# Patient Record
Sex: Male | Born: 1965 | Race: White | Marital: Single
Health system: Northeastern US, Academic
[De-identification: ages and names within clinical notes are randomized; demographics above are authoritative.]

## PROBLEM LIST (undated history)

## (undated) DIAGNOSIS — M545 Low back pain, unspecified: Secondary | ICD-10-CM

## (undated) DIAGNOSIS — G8929 Other chronic pain: Secondary | ICD-10-CM

## (undated) DIAGNOSIS — J449 Chronic obstructive pulmonary disease, unspecified: Secondary | ICD-10-CM

## (undated) DIAGNOSIS — J189 Pneumonia, unspecified organism: Secondary | ICD-10-CM

## (undated) DIAGNOSIS — I10 Essential (primary) hypertension: Secondary | ICD-10-CM

## (undated) HISTORY — PX: BACK SURGERY: SHX140

---

## 2013-08-26 DIAGNOSIS — R059 Cough, unspecified: Secondary | ICD-10-CM | POA: Diagnosis not present

## 2013-08-26 DIAGNOSIS — M545 Low back pain, unspecified: Secondary | ICD-10-CM | POA: Diagnosis not present

## 2013-08-26 DIAGNOSIS — R05 Cough: Secondary | ICD-10-CM | POA: Diagnosis not present

## 2013-08-26 DIAGNOSIS — J019 Acute sinusitis, unspecified: Secondary | ICD-10-CM | POA: Diagnosis not present

## 2013-08-26 DIAGNOSIS — M549 Dorsalgia, unspecified: Secondary | ICD-10-CM | POA: Diagnosis not present

## 2013-09-13 DIAGNOSIS — R05 Cough: Secondary | ICD-10-CM | POA: Diagnosis not present

## 2013-09-13 DIAGNOSIS — R112 Nausea with vomiting, unspecified: Secondary | ICD-10-CM | POA: Diagnosis not present

## 2013-09-13 DIAGNOSIS — R059 Cough, unspecified: Secondary | ICD-10-CM | POA: Diagnosis not present

## 2013-09-13 DIAGNOSIS — R0602 Shortness of breath: Secondary | ICD-10-CM | POA: Diagnosis not present

## 2013-09-13 DIAGNOSIS — J189 Pneumonia, unspecified organism: Secondary | ICD-10-CM | POA: Diagnosis not present

## 2013-09-15 DIAGNOSIS — R059 Cough, unspecified: Secondary | ICD-10-CM | POA: Diagnosis not present

## 2013-09-15 DIAGNOSIS — R05 Cough: Secondary | ICD-10-CM | POA: Diagnosis not present

## 2013-09-22 DIAGNOSIS — M2550 Pain in unspecified joint: Secondary | ICD-10-CM | POA: Diagnosis not present

## 2013-09-22 DIAGNOSIS — I10 Essential (primary) hypertension: Secondary | ICD-10-CM | POA: Diagnosis not present

## 2013-09-22 DIAGNOSIS — J449 Chronic obstructive pulmonary disease, unspecified: Secondary | ICD-10-CM | POA: Diagnosis not present

## 2013-09-22 DIAGNOSIS — E291 Testicular hypofunction: Secondary | ICD-10-CM | POA: Diagnosis not present

## 2013-10-16 DIAGNOSIS — J209 Acute bronchitis, unspecified: Secondary | ICD-10-CM | POA: Diagnosis not present

## 2013-10-16 DIAGNOSIS — R0902 Hypoxemia: Secondary | ICD-10-CM | POA: Diagnosis not present

## 2013-10-19 DIAGNOSIS — R05 Cough: Secondary | ICD-10-CM | POA: Diagnosis not present

## 2013-10-19 DIAGNOSIS — R059 Cough, unspecified: Secondary | ICD-10-CM | POA: Diagnosis not present

## 2013-10-19 DIAGNOSIS — J189 Pneumonia, unspecified organism: Secondary | ICD-10-CM | POA: Diagnosis not present

## 2013-10-19 DIAGNOSIS — M25559 Pain in unspecified hip: Secondary | ICD-10-CM | POA: Diagnosis not present

## 2013-10-19 DIAGNOSIS — J209 Acute bronchitis, unspecified: Secondary | ICD-10-CM | POA: Diagnosis not present

## 2013-10-19 DIAGNOSIS — J449 Chronic obstructive pulmonary disease, unspecified: Secondary | ICD-10-CM | POA: Diagnosis not present

## 2013-10-19 DIAGNOSIS — E291 Testicular hypofunction: Secondary | ICD-10-CM | POA: Diagnosis not present

## 2013-11-02 DIAGNOSIS — F411 Generalized anxiety disorder: Secondary | ICD-10-CM | POA: Diagnosis not present

## 2013-11-02 DIAGNOSIS — I10 Essential (primary) hypertension: Secondary | ICD-10-CM | POA: Diagnosis not present

## 2013-11-02 DIAGNOSIS — M25559 Pain in unspecified hip: Secondary | ICD-10-CM | POA: Diagnosis not present

## 2013-11-02 DIAGNOSIS — J189 Pneumonia, unspecified organism: Secondary | ICD-10-CM | POA: Diagnosis not present

## 2013-11-02 DIAGNOSIS — Z87891 Personal history of nicotine dependence: Secondary | ICD-10-CM | POA: Diagnosis not present

## 2013-11-02 DIAGNOSIS — Z0000 Encounter for general adult medical examination without abnormal findings: Secondary | ICD-10-CM | POA: Diagnosis not present

## 2013-11-02 DIAGNOSIS — E291 Testicular hypofunction: Secondary | ICD-10-CM | POA: Diagnosis not present

## 2013-11-02 DIAGNOSIS — J449 Chronic obstructive pulmonary disease, unspecified: Secondary | ICD-10-CM | POA: Diagnosis not present

## 2013-11-15 DIAGNOSIS — J392 Other diseases of pharynx: Secondary | ICD-10-CM | POA: Diagnosis not present

## 2013-11-15 DIAGNOSIS — I10 Essential (primary) hypertension: Secondary | ICD-10-CM | POA: Diagnosis not present

## 2013-11-15 DIAGNOSIS — J189 Pneumonia, unspecified organism: Secondary | ICD-10-CM | POA: Diagnosis not present

## 2013-11-15 DIAGNOSIS — J449 Chronic obstructive pulmonary disease, unspecified: Secondary | ICD-10-CM | POA: Diagnosis not present

## 2013-12-12 DIAGNOSIS — J449 Chronic obstructive pulmonary disease, unspecified: Secondary | ICD-10-CM | POA: Diagnosis not present

## 2013-12-12 DIAGNOSIS — I10 Essential (primary) hypertension: Secondary | ICD-10-CM | POA: Diagnosis not present

## 2013-12-12 DIAGNOSIS — R059 Cough, unspecified: Secondary | ICD-10-CM | POA: Diagnosis not present

## 2013-12-12 DIAGNOSIS — R05 Cough: Secondary | ICD-10-CM | POA: Diagnosis not present

## 2013-12-12 DIAGNOSIS — M549 Dorsalgia, unspecified: Secondary | ICD-10-CM | POA: Diagnosis not present

## 2013-12-16 DIAGNOSIS — R0609 Other forms of dyspnea: Secondary | ICD-10-CM | POA: Diagnosis not present

## 2013-12-16 DIAGNOSIS — R0989 Other specified symptoms and signs involving the circulatory and respiratory systems: Secondary | ICD-10-CM | POA: Diagnosis not present

## 2013-12-20 DIAGNOSIS — R5383 Other fatigue: Secondary | ICD-10-CM | POA: Diagnosis not present

## 2013-12-20 DIAGNOSIS — R5381 Other malaise: Secondary | ICD-10-CM | POA: Diagnosis not present

## 2013-12-20 DIAGNOSIS — M542 Cervicalgia: Secondary | ICD-10-CM | POA: Diagnosis not present

## 2013-12-20 DIAGNOSIS — I429 Cardiomyopathy, unspecified: Secondary | ICD-10-CM | POA: Diagnosis not present

## 2013-12-20 DIAGNOSIS — J449 Chronic obstructive pulmonary disease, unspecified: Secondary | ICD-10-CM | POA: Diagnosis not present

## 2014-01-02 DIAGNOSIS — D1039 Benign neoplasm of other parts of mouth: Secondary | ICD-10-CM | POA: Diagnosis not present

## 2014-01-02 DIAGNOSIS — R498 Other voice and resonance disorders: Secondary | ICD-10-CM | POA: Diagnosis not present

## 2014-02-07 DIAGNOSIS — E291 Testicular hypofunction: Secondary | ICD-10-CM | POA: Diagnosis not present

## 2014-02-07 DIAGNOSIS — M549 Dorsalgia, unspecified: Secondary | ICD-10-CM | POA: Diagnosis not present

## 2014-02-07 DIAGNOSIS — J449 Chronic obstructive pulmonary disease, unspecified: Secondary | ICD-10-CM | POA: Diagnosis not present

## 2014-02-07 DIAGNOSIS — I10 Essential (primary) hypertension: Secondary | ICD-10-CM | POA: Diagnosis not present

## 2014-02-13 DIAGNOSIS — M545 Low back pain, unspecified: Secondary | ICD-10-CM | POA: Diagnosis not present

## 2014-02-13 DIAGNOSIS — M5137 Other intervertebral disc degeneration, lumbosacral region: Secondary | ICD-10-CM | POA: Diagnosis not present

## 2014-02-13 DIAGNOSIS — IMO0002 Reserved for concepts with insufficient information to code with codable children: Secondary | ICD-10-CM | POA: Diagnosis not present

## 2014-03-06 DIAGNOSIS — F17200 Nicotine dependence, unspecified, uncomplicated: Secondary | ICD-10-CM | POA: Diagnosis not present

## 2014-03-06 DIAGNOSIS — E291 Testicular hypofunction: Secondary | ICD-10-CM | POA: Diagnosis not present

## 2014-03-06 DIAGNOSIS — J449 Chronic obstructive pulmonary disease, unspecified: Secondary | ICD-10-CM | POA: Diagnosis not present

## 2014-03-06 DIAGNOSIS — F411 Generalized anxiety disorder: Secondary | ICD-10-CM | POA: Diagnosis not present

## 2014-03-06 DIAGNOSIS — M25559 Pain in unspecified hip: Secondary | ICD-10-CM | POA: Diagnosis not present

## 2014-03-06 DIAGNOSIS — E669 Obesity, unspecified: Secondary | ICD-10-CM | POA: Diagnosis not present

## 2014-03-06 DIAGNOSIS — G608 Other hereditary and idiopathic neuropathies: Secondary | ICD-10-CM | POA: Diagnosis not present

## 2014-03-06 DIAGNOSIS — I10 Essential (primary) hypertension: Secondary | ICD-10-CM | POA: Diagnosis not present

## 2014-03-21 DIAGNOSIS — M25559 Pain in unspecified hip: Secondary | ICD-10-CM | POA: Diagnosis not present

## 2014-03-21 DIAGNOSIS — E278 Other specified disorders of adrenal gland: Secondary | ICD-10-CM | POA: Diagnosis not present

## 2014-04-03 DIAGNOSIS — M545 Low back pain, unspecified: Secondary | ICD-10-CM | POA: Diagnosis not present

## 2014-04-03 DIAGNOSIS — M549 Dorsalgia, unspecified: Secondary | ICD-10-CM | POA: Diagnosis not present

## 2014-04-03 DIAGNOSIS — F411 Generalized anxiety disorder: Secondary | ICD-10-CM | POA: Diagnosis not present

## 2014-04-03 DIAGNOSIS — I10 Essential (primary) hypertension: Secondary | ICD-10-CM | POA: Diagnosis not present

## 2014-05-03 DIAGNOSIS — G608 Other hereditary and idiopathic neuropathies: Secondary | ICD-10-CM | POA: Diagnosis not present

## 2014-05-03 DIAGNOSIS — J449 Chronic obstructive pulmonary disease, unspecified: Secondary | ICD-10-CM | POA: Diagnosis not present

## 2014-05-03 DIAGNOSIS — Z6836 Body mass index (BMI) 36.0-36.9, adult: Secondary | ICD-10-CM | POA: Diagnosis not present

## 2014-05-03 DIAGNOSIS — E291 Testicular hypofunction: Secondary | ICD-10-CM | POA: Diagnosis not present

## 2014-05-03 DIAGNOSIS — I10 Essential (primary) hypertension: Secondary | ICD-10-CM | POA: Diagnosis not present

## 2014-05-13 DIAGNOSIS — R0602 Shortness of breath: Secondary | ICD-10-CM | POA: Diagnosis not present

## 2014-05-13 DIAGNOSIS — K219 Gastro-esophageal reflux disease without esophagitis: Secondary | ICD-10-CM | POA: Diagnosis not present

## 2014-05-13 DIAGNOSIS — I10 Essential (primary) hypertension: Secondary | ICD-10-CM | POA: Diagnosis not present

## 2014-05-13 DIAGNOSIS — J189 Pneumonia, unspecified organism: Secondary | ICD-10-CM | POA: Diagnosis not present

## 2014-05-13 DIAGNOSIS — J441 Chronic obstructive pulmonary disease with (acute) exacerbation: Secondary | ICD-10-CM | POA: Diagnosis not present

## 2014-05-13 DIAGNOSIS — J449 Chronic obstructive pulmonary disease, unspecified: Secondary | ICD-10-CM | POA: Diagnosis not present

## 2014-05-13 DIAGNOSIS — G8929 Other chronic pain: Secondary | ICD-10-CM | POA: Diagnosis present

## 2014-05-13 DIAGNOSIS — Z6838 Body mass index (BMI) 38.0-38.9, adult: Secondary | ICD-10-CM | POA: Diagnosis not present

## 2014-05-13 DIAGNOSIS — J9600 Acute respiratory failure, unspecified whether with hypoxia or hypercapnia: Secondary | ICD-10-CM | POA: Diagnosis not present

## 2014-05-13 DIAGNOSIS — G4733 Obstructive sleep apnea (adult) (pediatric): Secondary | ICD-10-CM | POA: Diagnosis not present

## 2014-05-13 DIAGNOSIS — R51 Headache: Secondary | ICD-10-CM | POA: Diagnosis not present

## 2014-05-13 DIAGNOSIS — Z87891 Personal history of nicotine dependence: Secondary | ICD-10-CM | POA: Diagnosis not present

## 2014-05-13 DIAGNOSIS — M549 Dorsalgia, unspecified: Secondary | ICD-10-CM | POA: Diagnosis present

## 2014-05-18 DIAGNOSIS — M25559 Pain in unspecified hip: Secondary | ICD-10-CM | POA: Diagnosis not present

## 2014-05-18 DIAGNOSIS — M76899 Other specified enthesopathies of unspecified lower limb, excluding foot: Secondary | ICD-10-CM | POA: Diagnosis not present

## 2014-05-30 DIAGNOSIS — Z6837 Body mass index (BMI) 37.0-37.9, adult: Secondary | ICD-10-CM | POA: Diagnosis not present

## 2014-05-30 DIAGNOSIS — I10 Essential (primary) hypertension: Secondary | ICD-10-CM | POA: Diagnosis not present

## 2014-05-30 DIAGNOSIS — M549 Dorsalgia, unspecified: Secondary | ICD-10-CM | POA: Diagnosis not present

## 2014-05-30 DIAGNOSIS — Z23 Encounter for immunization: Secondary | ICD-10-CM | POA: Diagnosis not present

## 2014-05-30 DIAGNOSIS — J449 Chronic obstructive pulmonary disease, unspecified: Secondary | ICD-10-CM | POA: Diagnosis not present

## 2014-05-30 DIAGNOSIS — E291 Testicular hypofunction: Secondary | ICD-10-CM | POA: Diagnosis not present

## 2014-05-30 DIAGNOSIS — Z6836 Body mass index (BMI) 36.0-36.9, adult: Secondary | ICD-10-CM | POA: Diagnosis not present

## 2014-06-29 DIAGNOSIS — F419 Anxiety disorder, unspecified: Secondary | ICD-10-CM | POA: Diagnosis not present

## 2014-06-29 DIAGNOSIS — K219 Gastro-esophageal reflux disease without esophagitis: Secondary | ICD-10-CM | POA: Diagnosis not present

## 2014-06-29 DIAGNOSIS — I10 Essential (primary) hypertension: Secondary | ICD-10-CM | POA: Diagnosis not present

## 2014-06-29 DIAGNOSIS — J449 Chronic obstructive pulmonary disease, unspecified: Secondary | ICD-10-CM | POA: Diagnosis not present

## 2014-06-29 DIAGNOSIS — G8929 Other chronic pain: Secondary | ICD-10-CM | POA: Diagnosis not present

## 2014-06-29 DIAGNOSIS — M545 Low back pain: Secondary | ICD-10-CM | POA: Diagnosis not present

## 2014-06-30 DIAGNOSIS — D869 Sarcoidosis, unspecified: Secondary | ICD-10-CM | POA: Diagnosis not present

## 2014-06-30 DIAGNOSIS — I10 Essential (primary) hypertension: Secondary | ICD-10-CM | POA: Diagnosis not present

## 2014-06-30 DIAGNOSIS — J449 Chronic obstructive pulmonary disease, unspecified: Secondary | ICD-10-CM | POA: Diagnosis not present

## 2014-06-30 DIAGNOSIS — M545 Low back pain: Secondary | ICD-10-CM | POA: Diagnosis not present

## 2014-07-27 DIAGNOSIS — Z79899 Other long term (current) drug therapy: Secondary | ICD-10-CM | POA: Diagnosis not present

## 2014-07-27 DIAGNOSIS — M961 Postlaminectomy syndrome, not elsewhere classified: Secondary | ICD-10-CM | POA: Diagnosis not present

## 2014-07-27 DIAGNOSIS — M47817 Spondylosis without myelopathy or radiculopathy, lumbosacral region: Secondary | ICD-10-CM | POA: Diagnosis not present

## 2014-07-27 DIAGNOSIS — Z79891 Long term (current) use of opiate analgesic: Secondary | ICD-10-CM | POA: Diagnosis not present

## 2014-07-27 DIAGNOSIS — M5137 Other intervertebral disc degeneration, lumbosacral region: Secondary | ICD-10-CM | POA: Diagnosis not present

## 2014-07-27 DIAGNOSIS — G894 Chronic pain syndrome: Secondary | ICD-10-CM | POA: Diagnosis not present

## 2014-08-27 ENCOUNTER — Emergency Department (HOSPITAL_COMMUNITY)
Admission: EM | Admit: 2014-08-27 | Discharge: 2014-08-27 | Disposition: A | Discharge status: Home / Self Care | Payer: Medicare Other | Attending: Emergency Medicine | Admitting: Emergency Medicine

## 2014-08-27 ENCOUNTER — Emergency Department (HOSPITAL_COMMUNITY): Payer: Medicare Other

## 2014-08-27 ENCOUNTER — Encounter (HOSPITAL_COMMUNITY): Payer: Self-pay | Admitting: Emergency Medicine

## 2014-08-27 DIAGNOSIS — J441 Chronic obstructive pulmonary disease with (acute) exacerbation: Secondary | ICD-10-CM | POA: Insufficient documentation

## 2014-08-27 DIAGNOSIS — J189 Pneumonia, unspecified organism: Secondary | ICD-10-CM | POA: Diagnosis not present

## 2014-08-27 DIAGNOSIS — Z7951 Long term (current) use of inhaled steroids: Secondary | ICD-10-CM | POA: Insufficient documentation

## 2014-08-27 DIAGNOSIS — Z7982 Long term (current) use of aspirin: Secondary | ICD-10-CM | POA: Diagnosis not present

## 2014-08-27 DIAGNOSIS — E669 Obesity, unspecified: Secondary | ICD-10-CM | POA: Insufficient documentation

## 2014-08-27 DIAGNOSIS — J984 Other disorders of lung: Secondary | ICD-10-CM | POA: Diagnosis not present

## 2014-08-27 DIAGNOSIS — I10 Essential (primary) hypertension: Secondary | ICD-10-CM | POA: Diagnosis not present

## 2014-08-27 DIAGNOSIS — G8929 Other chronic pain: Secondary | ICD-10-CM | POA: Diagnosis not present

## 2014-08-27 DIAGNOSIS — Z720 Tobacco use: Secondary | ICD-10-CM | POA: Insufficient documentation

## 2014-08-27 DIAGNOSIS — J159 Unspecified bacterial pneumonia: Secondary | ICD-10-CM | POA: Diagnosis not present

## 2014-08-27 DIAGNOSIS — Z79899 Other long term (current) drug therapy: Secondary | ICD-10-CM | POA: Insufficient documentation

## 2014-08-27 DIAGNOSIS — R0602 Shortness of breath: Secondary | ICD-10-CM | POA: Diagnosis present

## 2014-08-27 HISTORY — DX: Essential (primary) hypertension: I10

## 2014-08-27 HISTORY — DX: Chronic obstructive pulmonary disease, unspecified: J44.9

## 2014-08-27 HISTORY — DX: Low back pain, unspecified: M54.50

## 2014-08-27 HISTORY — DX: Low back pain: M54.5

## 2014-08-27 HISTORY — DX: Other chronic pain: G89.29

## 2014-08-27 LAB — CBC
HCT: 39.9 % (ref 39.0–52.0)
Hemoglobin: 13.4 g/dL (ref 13.0–17.0)
MCH: 33 pg (ref 26.0–34.0)
MCHC: 33.6 g/dL (ref 30.0–36.0)
MCV: 98.3 fL (ref 78.0–100.0)
Platelets: 269 10*3/uL (ref 150–400)
RBC: 4.06 MIL/uL — ABNORMAL LOW (ref 4.22–5.81)
RDW: 12.6 % (ref 11.5–15.5)
WBC: 26.6 10*3/uL — ABNORMAL HIGH (ref 4.0–10.5)

## 2014-08-27 LAB — BASIC METABOLIC PANEL
Anion gap: 8 (ref 5–15)
BUN: 15 mg/dL (ref 6–23)
CO2: 29 mmol/L (ref 19–32)
Calcium: 8.5 mg/dL (ref 8.4–10.5)
Chloride: 100 mEq/L (ref 96–112)
Creatinine, Ser: 0.77 mg/dL (ref 0.50–1.35)
GFR calc Af Amer: >90 mL/min (ref >90)
GFR calc non Af Amer: >90 mL/min (ref >90)
Glucose, Bld: 128 mg/dL — ABNORMAL HIGH (ref 70–99)
Potassium: 3.7 mmol/L (ref 3.5–5.1)
Sodium: 137 mmol/L (ref 135–145)

## 2014-08-27 LAB — I-STAT TROPONIN, ED: Troponin i, poc: 0 ng/mL (ref 0.00–0.08)

## 2014-08-27 MED ORDER — OXYCODONE HCL 5 MG PO TABS
20.0000 mg | ORAL_TABLET | Freq: Once | ORAL | Status: AC
  Administered 2014-08-27: 20 mg via ORAL
  Filled 2014-08-27: qty 4

## 2014-08-27 MED ORDER — IPRATROPIUM-ALBUTEROL 0.5-2.5 (3) MG/3ML IN SOLN: 3.0000 mL | RESPIRATORY_TRACT | Status: AC | PRN

## 2014-08-27 MED ORDER — DEXTROSE 5 % IV SOLN
500.0000 mg | Freq: Once | INTRAVENOUS | Status: AC
  Administered 2014-08-27: 500 mg via INTRAVENOUS
  Filled 2014-08-27: qty 500

## 2014-08-27 MED ORDER — AZITHROMYCIN 250 MG PO TABS: 250.0000 mg | ORAL_TABLET | Freq: Every day | ORAL | Status: DC

## 2014-08-27 MED ORDER — ONDANSETRON 4 MG PO TBDP: ORAL_TABLET | ORAL | Status: AC

## 2014-08-27 MED ORDER — IPRATROPIUM-ALBUTEROL 0.5-2.5 (3) MG/3ML IN SOLN
3.0000 mL | Freq: Once | RESPIRATORY_TRACT | Status: AC
  Administered 2014-08-27: 3 mL via RESPIRATORY_TRACT
  Filled 2014-08-27: qty 3

## 2014-08-27 MED ORDER — DEXTROSE 5 % IV SOLN
1.0000 g | Freq: Once | INTRAVENOUS | Status: AC
  Administered 2014-08-27: 1 g via INTRAVENOUS
  Filled 2014-08-27: qty 10

## 2014-08-27 NOTE — ED Notes (Signed)
Pulse Ox dropped to 88% while ambulating.

## 2014-08-27 NOTE — ED Provider Notes (Signed)
CSN: [id number]     Arrival date & time 08/27/14  1240 History   First MD Initiated Contact with Patient 08/27/14 1414     Chief Complaint  Patient presents with  . Shortness of Breath  . Headache  . upper body pain      (Consider location/radiation/quality/duration/timing/severity/associated sxs/prior Treatment) HPI Trevor Sullivan is a 49 y.o. male with a history of COPD, hypertension comes in for evaluation of cough and shortness of breath. Patient states he began to feel bad this morning with headache,chest discomfort, cough, shortness of breath, he had his oxygen saturations checked and reported it to be 82%. He also reports an associated fever at home, but did not measure it. He is not on home oxygen. He reports his significant other is a PA who evaluated him this morning. He did not try anything for his symptoms. He denies , nausea, vomiting, diarrhea, constipation, overt chest pain, numbness or weakness. No other modifying factors Smoker. Past Medical History  Diagnosis Date  . Hypertension   . COPD (chronic obstructive pulmonary disease)   . Chronic low back pain    Past Surgical History  Procedure Laterality Date  . Back surgery     No family history on file. History  Substance Use Topics  . Smoking status: Current Every Day Smoker    Types: Cigarettes  . Smokeless tobacco: Not on file  . Alcohol Use: No    Review of Systems  A 10 point review of systems was completed and was negative except for pertinent positives and negatives as mentioned in the history of present illness    Allergies  Codeine; Morphine and related; and Other  Home Medications   Prior to Admission medications   Medication Sig Start Date End Date Taking? Authorizing Provider  amLODipine-benazepril (LOTREL) 10-40 MG per capsule Take 1 capsule by mouth daily.   Yes Historical Provider, MD  aspirin EC 81 MG tablet Take 81 mg by mouth daily.   Yes Historical Provider, MD  bisoprolol (ZEBETA) 10  MG tablet Take 20 mg by mouth daily.   Yes Historical Provider, MD  budesonide-formoterol (SYMBICORT) 160-4.5 MCG/ACT inhaler Inhale 2 puffs into the lungs daily.   Yes Historical Provider, MD  cloNIDine (CATAPRES) 0.2 MG tablet Take 0.2 mg by mouth 2 (two) times daily.   Yes Historical Provider, MD  diazepam (VALIUM) 5 MG tablet Take 5 mg by mouth 3 (three) times daily.   Yes Historical Provider, MD  fentaNYL (DURAGESIC - DOSED MCG/HR) 100 MCG/HR Place 100 mcg onto the skin every other day.   Yes Historical Provider, MD  gabapentin (NEURONTIN) 600 MG tablet Take 1,200 mg by mouth 4 (four) times daily.   Yes Historical Provider, MD  ibuprofen (ADVIL,MOTRIN) 200 MG tablet Take 400 mg by mouth every 6 (six) hours as needed for fever or moderate pain.   Yes Historical Provider, MD  ondansetron (ZOFRAN) 4 MG tablet Take 4 mg by mouth 2 (two) times daily.   Yes Historical Provider, MD  oxycodone (ROXICODONE) 30 MG immediate release tablet Take 30 mg by mouth 3 (three) times daily.   Yes Historical Provider, MD  pantoprazole (PROTONIX) 40 MG tablet Take 40 mg by mouth daily.   Yes Historical Provider, MD  tiotropium (SPIRIVA) 18 MCG inhalation capsule Place 18 mcg into inhaler and inhale daily.   Yes Historical Provider, MD  azithromycin (ZITHROMAX) 250 MG tablet Take 1 tablet (250 mg total) by mouth daily. Take first 2 tablets together, then 1  every day until finished. 08/27/14   Viona Gilmore Trevor Dinkins, PA-C  ipratropium-albuterol (DUONEB) 0.5-2.5 (3) MG/3ML SOLN Take 3 mLs by nebulization every 4 (four) hours as needed. 08/27/14   Verl Dicker, PA-C  ondansetron (ZOFRAN ODT) 4 MG disintegrating tablet 4mg  ODT q4 hours prn nausea/vomit 08/27/14   Viona Gilmore Trevor Skufca, PA-C   BP 121/52 mmHg  Pulse 70  Temp(Src) 98.7 F (37.1 C) (Oral)  Resp 16  Ht 5\' 4"  (1.626 m)  Wt 220 lb (99.791 kg)  BMI 37.74 kg/m2  SpO2 95% Physical Exam  Constitutional: He is oriented to person, place, and time. He appears  well-developed and well-nourished.  obese  HENT:  Head: Normocephalic and atraumatic.  Mouth/Throat: Oropharynx is clear and moist.  Eyes: Conjunctivae are normal. Pupils are equal, round, and reactive to light. Right eye exhibits no discharge. Left eye exhibits no discharge. No scleral icterus.  Neck: Neck supple.  Cardiovascular: Normal rate, regular rhythm and normal heart sounds.   Pulmonary/Chest: Effort normal. No respiratory distress.  Diffuse coarse breath sounds in right lung with expiratory wheezing heard diffusely in all fields  Abdominal: Soft. There is no tenderness.  Musculoskeletal: He exhibits no tenderness.  Neurological: He is alert and oriented to person, place, and time.  Cranial Nerves II-XII grossly intact  Skin: Skin is warm and dry. No rash noted.  Psychiatric: He has a normal mood and affect.  Nursing note and vitals reviewed.   ED Course  Procedures (including critical care time) Labs Review Labs Reviewed  BASIC METABOLIC PANEL - Abnormal; Notable for the following:    Glucose, Bld 128 (*)    All other components within normal limits  CBC - Abnormal; Notable for the following:    WBC 26.6 (*)    RBC 4.06 (*)    All other components within normal limits  I-STAT TROPOININ, ED    Imaging Review Dg Chest 2 View (if Patient Has Fever And/or Copd)  08/27/2014   CLINICAL DATA:  Initial encounter for Cough today  EXAM: CHEST  2 VIEW  COMPARISON:  None.  FINDINGS: Two view exam shows patchy airspace disease in the right upper and lower lung. Left lung is clear. The cardiopericardial silhouette is within normal limits for size. Imaged bony structures of the thorax are intact. Telemetry leads overlie the chest.  IMPRESSION: Patchy right lung airspace disease suggest multifocal pneumonia. Follow-up imaging is recommended to ensure resolution.   Electronically Signed   By: Misty Stanley M.D.   On: 08/27/2014 13:44     EKG Interpretation None     Meds given in  ED:  Medications  azithromycin (ZITHROMAX) 500 mg in dextrose 5 % 250 mL IVPB (500 mg Intravenous New Bag/Given 08/27/14 1600)  ipratropium-albuterol (DUONEB) 0.5-2.5 (3) MG/3ML nebulizer solution 3 mL (3 mLs Nebulization Given 08/27/14 1403)  oxyCODONE (Oxy IR/ROXICODONE) immediate release tablet 20 mg (20 mg Oral Given 08/27/14 1452)  cefTRIAXone (ROCEPHIN) 1 g in dextrose 5 % 50 mL IVPB (0 g Intravenous Stopped 08/27/14 1559)    New Prescriptions   AZITHROMYCIN (ZITHROMAX) 250 MG TABLET    Take 1 tablet (250 mg total) by mouth daily. Take first 2 tablets together, then 1 every day until finished.   IPRATROPIUM-ALBUTEROL (DUONEB) 0.5-2.5 (3) MG/3ML SOLN    Take 3 mLs by nebulization every 4 (four) hours as needed.   ONDANSETRON (ZOFRAN ODT) 4 MG DISINTEGRATING TABLET    4mg  ODT q4 hours prn nausea/vomit   Filed Vitals:  08/27/14 1315 08/27/14 1317 08/27/14 1441 08/27/14 1603  BP:   95/61 121/52  Pulse:   79 70  Temp:      TempSrc:      Resp:   16   Height:  5\' 4"  (1.626 m)    Weight:  220 lb (99.791 kg)    SpO2: 94%  94% 95%    MDM  Trevor Sullivan is a 49 y.o. male who presents for evaluation of fever, cough, chest congestion, shortness of breath for the past day.  Course breath sounds with wheezing diffusely. Found to have multifocal pneumonia on x-ray. Leukocytosis of 26.6 Patient ambulated in the hall and saturations dropped to 88%.  Refuses admission. Prefers to go home with outpatient antibiotics and follow-up with his primary care. States his wife is a PA and will take care of him at home. Discussed the risks and benefits of receiving outpatient therapy, patient understands and is willing to assume this risk.  Received IV abx in ED. Will DC with azithromycin, DuoNeb refill, Zofran and instructions to follow-up with PCP  Prior to patient discharge, I discussed and reviewed this case with Dr.Kohut  Patient may be discharged following completion of IV antibiotic regimen. Care  transferred to Domenic Moras, PA-C to complete discharge.    Final diagnoses:  CAP (community acquired pneumonia)        Verl Dicker, PA-C 08/28/14 1154  Virgel Manifold, MD 08/31/14 [phone]

## 2014-08-27 NOTE — ED Notes (Addendum)
Pt c/o shob, headache and body pain from waist up that started day before yesterday but got worse yesterday. Visitor with pt states that she is PA, this morning pt's O2 level was in 80's on room air. Pt had temperature 101.

## 2014-08-27 NOTE — ED Provider Notes (Signed)
Pt with newly diagnosed multifocal pneumonia, likely CAP.  He initially was hypoxic, hypotensive, and with leukocytosis WBC 26.  Pt meets admission criteria however he prefers to be discharged and treated outpt instead.  He is aware of risk/benefit and understand to return if condition worsen.  His wife is a PA.  Pt amenable to receive IV abx here.  Will monitor and d/c once abx is finished.  Pt does have a PCP and agrees to f/u for repeat CXR.    5:09 PM Patient received his antibiotic and now he would like to be discharged. He is aware of risk and will return promptly if his condition worsened.  BP 121/52 mmHg  Pulse 70  Temp(Src) 98.7 F (37.1 C) (Oral)  Resp 16  Ht 5\' 4"  (1.626 m)  Wt 220 lb (99.791 kg)  BMI 37.74 kg/m2  SpO2 95%  I have reviewed nursing notes and vital signs. I personally reviewed the imaging tests through PACS system  I reviewed available ER/hospitalization records thought the EMR  Results for orders placed or performed during the hospital encounter of 81/15/72  Basic metabolic panel    (if pt has PMH of COPD)  Result Value Ref Range   Sodium 137 135 - 145 mmol/L   Potassium 3.7 3.5 - 5.1 mmol/L   Chloride 100 96 - 112 mEq/L   CO2 29 19 - 32 mmol/L   Glucose, Bld 128 (H) 70 - 99 mg/dL   BUN 15 6 - 23 mg/dL   Creatinine, Ser 0.77 0.50 - 1.35 mg/dL   Calcium 8.5 8.4 - 10.5 mg/dL   GFR calc non Af Amer >90 >90 mL/min   GFR calc Af Amer >90 >90 mL/min   Anion gap 8 5 - 15  CBC     (if pt has PMH of COPD)  Result Value Ref Range   WBC 26.6 (H) 4.0 - 10.5 K/uL   RBC 4.06 (L) 4.22 - 5.81 MIL/uL   Hemoglobin 13.4 13.0 - 17.0 g/dL   HCT 39.9 39.0 - 52.0 %   MCV 98.3 78.0 - 100.0 fL   MCH 33.0 26.0 - 34.0 pg   MCHC 33.6 30.0 - 36.0 g/dL   RDW 12.6 11.5 - 15.5 %   Platelets 269 150 - 400 K/uL  I-stat troponin, ED (if patient has history of COPD)  Result Value Ref Range   Troponin i, poc 0.00 0.00 - 0.08 ng/mL   Comment 3           Dg Chest 2 View (if  Patient Has Fever And/or Copd)  08/27/2014   CLINICAL DATA:  Initial encounter for Cough today  EXAM: CHEST  2 VIEW  COMPARISON:  None.  FINDINGS: Two view exam shows patchy airspace disease in the right upper and lower lung. Left lung is clear. The cardiopericardial silhouette is within normal limits for size. Imaged bony structures of the thorax are intact. Telemetry leads overlie the chest.  IMPRESSION: Patchy right lung airspace disease suggest multifocal pneumonia. Follow-up imaging is recommended to ensure resolution.   Electronically Signed   By: Misty Stanley M.D.   On: 08/27/2014 13:44      Domenic Moras, PA-C 08/27/14 [place], MD 08/27/14 2204

## 2014-08-27 NOTE — ED Notes (Signed)
EKG completed by Almyra Free, NT.

## 2014-08-27 NOTE — Discharge Instructions (Signed)
It is important for you to follow up with primary care for further evaluation and management of your symptoms. You will need to take all of your antibiotic as prescribed even If you begin to feel better. Please return to ED for worsening symptoms.

## 2014-08-27 NOTE — ED Notes (Signed)
[place], [place] notified that pt wants pain meds

## 2014-08-29 DIAGNOSIS — D72829 Elevated white blood cell count, unspecified: Secondary | ICD-10-CM | POA: Diagnosis not present

## 2014-08-29 DIAGNOSIS — I10 Essential (primary) hypertension: Secondary | ICD-10-CM | POA: Diagnosis not present

## 2014-08-29 DIAGNOSIS — R072 Precordial pain: Secondary | ICD-10-CM | POA: Diagnosis not present

## 2014-08-29 DIAGNOSIS — J189 Pneumonia, unspecified organism: Secondary | ICD-10-CM | POA: Diagnosis not present

## 2014-09-08 DIAGNOSIS — Z0000 Encounter for general adult medical examination without abnormal findings: Secondary | ICD-10-CM | POA: Diagnosis not present

## 2014-09-21 DIAGNOSIS — I10 Essential (primary) hypertension: Secondary | ICD-10-CM | POA: Diagnosis not present

## 2014-09-21 DIAGNOSIS — G894 Chronic pain syndrome: Secondary | ICD-10-CM | POA: Diagnosis not present

## 2014-10-23 ENCOUNTER — Inpatient Hospital Stay (HOSPITAL_COMMUNITY)
Admission: EM | Admit: 2014-10-23 | Discharge: 2014-10-25 | DRG: 193 | Disposition: A | Discharge status: Home / Self Care | Payer: Medicare Other | Attending: Internal Medicine | Admitting: Internal Medicine

## 2014-10-23 ENCOUNTER — Emergency Department (HOSPITAL_COMMUNITY): Payer: Medicare Other

## 2014-10-23 ENCOUNTER — Encounter (HOSPITAL_COMMUNITY): Payer: Self-pay

## 2014-10-23 ENCOUNTER — Observation Stay (HOSPITAL_COMMUNITY): Payer: Medicare Other

## 2014-10-23 DIAGNOSIS — Z8249 Family history of ischemic heart disease and other diseases of the circulatory system: Secondary | ICD-10-CM

## 2014-10-23 DIAGNOSIS — K219 Gastro-esophageal reflux disease without esophagitis: Secondary | ICD-10-CM

## 2014-10-23 DIAGNOSIS — R109 Unspecified abdominal pain: Secondary | ICD-10-CM | POA: Diagnosis not present

## 2014-10-23 DIAGNOSIS — K5900 Constipation, unspecified: Secondary | ICD-10-CM | POA: Diagnosis present

## 2014-10-23 DIAGNOSIS — R1084 Generalized abdominal pain: Secondary | ICD-10-CM | POA: Diagnosis not present

## 2014-10-23 DIAGNOSIS — J189 Pneumonia, unspecified organism: Principal | ICD-10-CM

## 2014-10-23 DIAGNOSIS — K828 Other specified diseases of gallbladder: Secondary | ICD-10-CM | POA: Diagnosis not present

## 2014-10-23 DIAGNOSIS — F17210 Nicotine dependence, cigarettes, uncomplicated: Secondary | ICD-10-CM | POA: Diagnosis present

## 2014-10-23 DIAGNOSIS — J441 Chronic obstructive pulmonary disease with (acute) exacerbation: Secondary | ICD-10-CM | POA: Diagnosis present

## 2014-10-23 DIAGNOSIS — R062 Wheezing: Secondary | ICD-10-CM | POA: Diagnosis not present

## 2014-10-23 DIAGNOSIS — J9600 Acute respiratory failure, unspecified whether with hypoxia or hypercapnia: Secondary | ICD-10-CM | POA: Diagnosis not present

## 2014-10-23 DIAGNOSIS — E860 Dehydration: Secondary | ICD-10-CM | POA: Diagnosis not present

## 2014-10-23 DIAGNOSIS — Z981 Arthrodesis status: Secondary | ICD-10-CM | POA: Diagnosis not present

## 2014-10-23 DIAGNOSIS — R918 Other nonspecific abnormal finding of lung field: Secondary | ICD-10-CM | POA: Diagnosis not present

## 2014-10-23 DIAGNOSIS — G8929 Other chronic pain: Secondary | ICD-10-CM

## 2014-10-23 DIAGNOSIS — I10 Essential (primary) hypertension: Secondary | ICD-10-CM | POA: Diagnosis not present

## 2014-10-23 DIAGNOSIS — R1110 Vomiting, unspecified: Secondary | ICD-10-CM

## 2014-10-23 DIAGNOSIS — Z79899 Other long term (current) drug therapy: Secondary | ICD-10-CM

## 2014-10-23 DIAGNOSIS — J9601 Acute respiratory failure with hypoxia: Secondary | ICD-10-CM | POA: Diagnosis not present

## 2014-10-23 DIAGNOSIS — K838 Other specified diseases of biliary tract: Secondary | ICD-10-CM | POA: Diagnosis not present

## 2014-10-23 DIAGNOSIS — M545 Low back pain: Secondary | ICD-10-CM | POA: Diagnosis present

## 2014-10-23 DIAGNOSIS — R112 Nausea with vomiting, unspecified: Secondary | ICD-10-CM

## 2014-10-23 DIAGNOSIS — R197 Diarrhea, unspecified: Secondary | ICD-10-CM | POA: Diagnosis not present

## 2014-10-23 LAB — URINALYSIS, ROUTINE W REFLEX MICROSCOPIC
Glucose, UA: NEGATIVE mg/dL
Hgb urine dipstick: NEGATIVE
KETONES UR: 15 mg/dL — AB
Leukocytes, UA: NEGATIVE
Nitrite: NEGATIVE
PH: 6.5 (ref 5.0–8.0)
Protein, ur: 100 mg/dL — AB
Specific Gravity, Urine: 1.019 (ref 1.005–1.030)
UROBILINOGEN UA: 1 mg/dL (ref 0.0–1.0)

## 2014-10-23 LAB — COMPREHENSIVE METABOLIC PANEL
ALT: 15 U/L (ref 0–53)
AST: 22 U/L (ref 0–37)
Albumin: 4.5 g/dL (ref 3.5–5.2)
Alkaline Phosphatase: 54 U/L (ref 39–117)
Anion gap: 14 (ref 5–15)
BUN: 24 mg/dL — ABNORMAL HIGH (ref 6–23)
CO2: 31 mmol/L (ref 19–32)
Calcium: 9.4 mg/dL (ref 8.4–10.5)
Chloride: 93 mmol/L — ABNORMAL LOW (ref 96–112)
Creatinine, Ser: 1.08 mg/dL (ref 0.50–1.35)
GFR calc Af Amer: >90 mL/min (ref >90)
GFR calc non Af Amer: 81 mL/min — ABNORMAL LOW (ref >90)
Glucose, Bld: 156 mg/dL — ABNORMAL HIGH (ref 70–99)
Potassium: 3.2 mmol/L — ABNORMAL LOW (ref 3.5–5.1)
Sodium: 138 mmol/L (ref 135–145)
Total Bilirubin: 1.2 mg/dL (ref 0.3–1.2)
Total Protein: 8.3 g/dL (ref 6.0–8.3)

## 2014-10-23 LAB — CBC WITH DIFFERENTIAL/PLATELET
Basophils Absolute: 0 10*3/uL (ref 0.0–0.1)
Basophils Relative: 0 % (ref 0–1)
Eosinophils Absolute: 0 10*3/uL (ref 0.0–0.7)
Eosinophils Relative: 0 % (ref 0–5)
HCT: 39.8 % (ref 39.0–52.0)
Hemoglobin: 13.4 g/dL (ref 13.0–17.0)
Lymphocytes Relative: 6 % — ABNORMAL LOW (ref 12–46)
Lymphs Abs: 1.4 10*3/uL (ref 0.7–4.0)
MCH: 32.9 pg (ref 26.0–34.0)
MCHC: 33.7 g/dL (ref 30.0–36.0)
MCV: 97.8 fL (ref 78.0–100.0)
Monocytes Absolute: 0.7 10*3/uL (ref 0.1–1.0)
Monocytes Relative: 3 % (ref 3–12)
Neutro Abs: 21 10*3/uL — ABNORMAL HIGH (ref 1.7–7.7)
Neutrophils Relative %: 91 % — ABNORMAL HIGH (ref 43–77)
Platelets: 389 10*3/uL (ref 150–400)
RBC: 4.07 MIL/uL — ABNORMAL LOW (ref 4.22–5.81)
RDW: 13.7 % (ref 11.5–15.5)
WBC: 23.2 10*3/uL — ABNORMAL HIGH (ref 4.0–10.5)

## 2014-10-23 LAB — URINE MICROSCOPIC-ADD ON

## 2014-10-23 LAB — I-STAT CG4 LACTIC ACID, ED
LACTIC ACID, VENOUS: 1.63 mmol/L (ref 0.5–2.0)
Lactic Acid, Venous: 0.67 mmol/L (ref 0.5–2.0)

## 2014-10-23 MED ORDER — AMLODIPINE BESYLATE 10 MG PO TABS
10.0000 mg | ORAL_TABLET | Freq: Every day | ORAL | Status: DC
  Administered 2014-10-24 – 2014-10-25 (×2): 10 mg via ORAL
  Filled 2014-10-23 (×2): qty 1

## 2014-10-23 MED ORDER — POLYETHYLENE GLYCOL 3350 17 G PO PACK
17.0000 g | PACK | Freq: Two times a day (BID) | ORAL | Status: DC
  Administered 2014-10-24 (×2): 17 g via ORAL
  Filled 2014-10-23 (×4): qty 1

## 2014-10-23 MED ORDER — GABAPENTIN 400 MG PO CAPS
1200.0000 mg | ORAL_CAPSULE | Freq: Four times a day (QID) | ORAL | Status: DC
  Administered 2014-10-23 – 2014-10-25 (×6): 1200 mg via ORAL
  Filled 2014-10-23 (×9): qty 3

## 2014-10-23 MED ORDER — HEPARIN SODIUM (PORCINE) 5000 UNIT/ML IJ SOLN
5000.0000 [IU] | Freq: Three times a day (TID) | INTRAMUSCULAR | Status: DC
  Administered 2014-10-23 – 2014-10-24 (×4): 5000 [IU] via SUBCUTANEOUS
  Filled 2014-10-23 (×8): qty 1

## 2014-10-23 MED ORDER — FENTANYL 100 MCG/HR TD PT72: 100.0000 ug | MEDICATED_PATCH | TRANSDERMAL | Status: DC

## 2014-10-23 MED ORDER — DIAZEPAM 5 MG PO TABS
5.0000 mg | ORAL_TABLET | Freq: Three times a day (TID) | ORAL | Status: DC
  Administered 2014-10-23 – 2014-10-25 (×5): 5 mg via ORAL
  Filled 2014-10-23 (×5): qty 1

## 2014-10-23 MED ORDER — BENAZEPRIL HCL 40 MG PO TABS
40.0000 mg | ORAL_TABLET | Freq: Every day | ORAL | Status: DC
  Administered 2014-10-24 – 2014-10-25 (×2): 40 mg via ORAL
  Filled 2014-10-23 (×2): qty 1

## 2014-10-23 MED ORDER — CLONIDINE HCL 0.3 MG PO TABS
0.3000 mg | ORAL_TABLET | Freq: Two times a day (BID) | ORAL | Status: DC
  Administered 2014-10-23 – 2014-10-25 (×4): 0.3 mg via ORAL
  Filled 2014-10-23 (×5): qty 1

## 2014-10-23 MED ORDER — ONDANSETRON HCL 4 MG/2ML IJ SOLN
4.0000 mg | Freq: Once | INTRAMUSCULAR | Status: AC
Start: 2014-10-23 — End: 2014-10-23
  Administered 2014-10-23: 4 mg via INTRAVENOUS
  Filled 2014-10-23: qty 2

## 2014-10-23 MED ORDER — ONDANSETRON HCL 4 MG/2ML IJ SOLN
4.0000 mg | Freq: Four times a day (QID) | INTRAMUSCULAR | Status: DC | PRN
  Administered 2014-10-23 – 2014-10-24 (×2): 4 mg via INTRAVENOUS
  Filled 2014-10-23 (×2): qty 2

## 2014-10-23 MED ORDER — ASPIRIN EC 81 MG PO TBEC
81.0000 mg | DELAYED_RELEASE_TABLET | Freq: Every day | ORAL | Status: DC
  Administered 2014-10-23 – 2014-10-25 (×3): 81 mg via ORAL
  Filled 2014-10-23 (×3): qty 1

## 2014-10-23 MED ORDER — METHYLPREDNISOLONE SODIUM SUCC 125 MG IJ SOLR
60.0000 mg | Freq: Four times a day (QID) | INTRAMUSCULAR | Status: DC
  Administered 2014-10-23 – 2014-10-25 (×7): 60 mg via INTRAVENOUS
  Filled 2014-10-23 (×11): qty 0.96

## 2014-10-23 MED ORDER — AZITHROMYCIN 250 MG PO TABS
250.0000 mg | ORAL_TABLET | Freq: Every day | ORAL | Status: DC
  Administered 2014-10-24 – 2014-10-25 (×2): 250 mg via ORAL
  Filled 2014-10-23 (×2): qty 1

## 2014-10-23 MED ORDER — OXYCODONE HCL 5 MG PO TABS
30.0000 mg | ORAL_TABLET | Freq: Three times a day (TID) | ORAL | Status: DC
  Administered 2014-10-23 – 2014-10-25 (×5): 30 mg via ORAL
  Filled 2014-10-23 (×5): qty 6

## 2014-10-23 MED ORDER — BISOPROLOL FUMARATE 10 MG PO TABS
20.0000 mg | ORAL_TABLET | Freq: Every day | ORAL | Status: DC
  Administered 2014-10-24 – 2014-10-25 (×2): 20 mg via ORAL
  Filled 2014-10-23 (×2): qty 2

## 2014-10-23 MED ORDER — IPRATROPIUM BROMIDE 0.02 % IN SOLN
0.5000 mg | Freq: Once | RESPIRATORY_TRACT | Status: AC
  Administered 2014-10-23: 0.5 mg via RESPIRATORY_TRACT
  Filled 2014-10-23: qty 2.5

## 2014-10-23 MED ORDER — SODIUM CHLORIDE 0.9 % IV SOLN
INTRAVENOUS | Status: DC
Start: 2014-10-23 — End: 2014-10-25
  Administered 2014-10-23 – 2014-10-24 (×3): via INTRAVENOUS
  Administered 2014-10-25: 1000 mL via INTRAVENOUS

## 2014-10-23 MED ORDER — HYDROMORPHONE HCL 1 MG/ML IJ SOLN
1.0000 mg | INTRAMUSCULAR | Status: AC | PRN
  Administered 2014-10-23 – 2014-10-24 (×3): 1 mg via INTRAVENOUS
  Filled 2014-10-23 (×3): qty 1

## 2014-10-23 MED ORDER — MAGNESIUM HYDROXIDE 400 MG/5ML PO SUSP
15.0000 mL | Freq: Once | ORAL | Status: AC
  Administered 2014-10-23: 15 mL via ORAL
  Filled 2014-10-23: qty 30

## 2014-10-23 MED ORDER — METOCLOPRAMIDE HCL 5 MG/ML IJ SOLN
10.0000 mg | Freq: Once | INTRAMUSCULAR | Status: AC
  Administered 2014-10-23: 10 mg via INTRAVENOUS
  Filled 2014-10-23: qty 2

## 2014-10-23 MED ORDER — IOHEXOL 300 MG/ML  SOLN
25.0000 mL | Freq: Once | INTRAMUSCULAR | Status: AC | PRN
  Administered 2014-10-23: 50 mL via ORAL

## 2014-10-23 MED ORDER — IOHEXOL 300 MG/ML  SOLN
100.0000 mL | Freq: Once | INTRAMUSCULAR | Status: AC | PRN
  Administered 2014-10-23: 100 mL via INTRAVENOUS

## 2014-10-23 MED ORDER — HYDROMORPHONE HCL 1 MG/ML IJ SOLN
1.0000 mg | Freq: Once | INTRAMUSCULAR | Status: AC
  Administered 2014-10-23: 1 mg via INTRAVENOUS
  Filled 2014-10-23: qty 1

## 2014-10-23 MED ORDER — DEXTROSE 5 % IV SOLN
500.0000 mg | Freq: Once | INTRAVENOUS | Status: AC
  Administered 2014-10-23: 500 mg via INTRAVENOUS
  Filled 2014-10-23: qty 500

## 2014-10-23 MED ORDER — PANTOPRAZOLE SODIUM 40 MG PO TBEC
40.0000 mg | DELAYED_RELEASE_TABLET | Freq: Every day | ORAL | Status: DC
  Administered 2014-10-23 – 2014-10-25 (×3): 40 mg via ORAL
  Filled 2014-10-23 (×3): qty 1

## 2014-10-23 MED ORDER — SODIUM CHLORIDE 0.9 % IV BOLUS (SEPSIS)
1000.0000 mL | Freq: Once | INTRAVENOUS | Status: AC
  Administered 2014-10-23: 1000 mL via INTRAVENOUS

## 2014-10-23 MED ORDER — SODIUM CHLORIDE 0.9 % IV SOLN
INTRAVENOUS | Status: AC
  Administered 2014-10-23: 19:00:00 via INTRAVENOUS

## 2014-10-23 MED ORDER — HYDRALAZINE HCL 20 MG/ML IJ SOLN: 5.0000 mg | INTRAMUSCULAR | Status: DC | PRN

## 2014-10-23 MED ORDER — ALBUTEROL SULFATE (2.5 MG/3ML) 0.083% IN NEBU
5.0000 mg | INHALATION_SOLUTION | Freq: Once | RESPIRATORY_TRACT | Status: AC
  Administered 2014-10-23: 5 mg via RESPIRATORY_TRACT
  Filled 2014-10-23: qty 6

## 2014-10-23 MED ORDER — DEXTROSE 5 % IV SOLN
1.0000 g | INTRAVENOUS | Status: DC
  Administered 2014-10-24: 1 g via INTRAVENOUS
  Filled 2014-10-23 (×2): qty 10

## 2014-10-23 MED ORDER — DEXTROSE 5 % IV SOLN
1.0000 g | Freq: Once | INTRAVENOUS | Status: AC
  Administered 2014-10-23: 1 g via INTRAVENOUS
  Filled 2014-10-23: qty 10

## 2014-10-23 MED ORDER — ONDANSETRON HCL 4 MG/2ML IJ SOLN
4.0000 mg | Freq: Once | INTRAMUSCULAR | Status: AC
  Administered 2014-10-23: 4 mg via INTRAVENOUS
  Filled 2014-10-23: qty 2

## 2014-10-23 MED ORDER — AMLODIPINE BESY-BENAZEPRIL HCL 10-40 MG PO CAPS: 1.0000 | ORAL_CAPSULE | Freq: Every day | ORAL | Status: DC

## 2014-10-23 MED ORDER — MAGNESIUM CITRATE PO SOLN: 1.0000 | Freq: Once | ORAL | Status: AC | PRN

## 2014-10-23 MED ORDER — SENNA 8.6 MG PO TABS
2.0000 | ORAL_TABLET | Freq: Two times a day (BID) | ORAL | Status: DC
Start: 2014-10-23 — End: 2014-10-25
  Administered 2014-10-23 – 2014-10-25 (×4): 17.2 mg via ORAL
  Filled 2014-10-23 (×4): qty 2

## 2014-10-23 NOTE — ED Notes (Signed)
Pt unable to void at this time. 

## 2014-10-23 NOTE — ED Provider Notes (Signed)
CSN: [id number]     Arrival date & time 10/23/14  1452 History   First MD Initiated Contact with Patient 10/23/14 1532     Chief Complaint  Patient presents with  . Emesis  . Abdominal Pain     (Consider location/radiation/quality/duration/timing/severity/associated sxs/prior Treatment) HPI    PCP: RAMACHANDRAN,AJITH, MD Blood pressure 116/68, pulse 76, temperature 97.5 F (36.4 C), temperature source Oral, resp. rate 16, SpO2 90 %.  Trevor Sullivan is a 49 y.o.male with a significant PMH of hypertension, COPD, and chronic low back pain presents to the ER with complaints of abdominal pain, nausea and vomiting. He had a tooth extraction 2 weeks ago and was started on Clindamycin. This caused 1 week of diarrhea. The diarrhea has since resolved and he is now having constipation for 1 week. He reports abdominal distention and diffuse abdominal pain. He also reports cough and wheezing at home. He has had oxygen saturations in the mid 70's at home and this improved with duonebs given to him by his wife. He has maintained a saturation of 96% on room air here in the ED. He describes being unable to keep down any solids or fluids for the past 3 days. His abdominal pain continues to worsen.  Denies CP, SOB, confusion, weakness, fever today, lower extremity swelling, rash, neck pain or headaches.   Past Medical History  Diagnosis Date  . Hypertension   . COPD (chronic obstructive pulmonary disease)   . Chronic low back pain    Past Surgical History  Procedure Laterality Date  . Back surgery     History reviewed. No pertinent family history. History  Substance Use Topics  . Smoking status: Current Every Day Smoker    Types: Cigarettes  . Smokeless tobacco: Not on file  . Alcohol Use: No    Review of Systems  10 Systems reviewed and are negative for acute change except as noted in the HPI.      Allergies  Codeine; Morphine and related; and Other  Home Medications   Prior to  Admission medications   Medication Sig Start Date End Date Taking? Authorizing Provider  amLODipine-benazepril (LOTREL) 10-40 MG per capsule Take 1 capsule by mouth daily.   Yes Historical Provider, MD  aspirin EC 81 MG tablet Take 81 mg by mouth daily.   Yes Historical Provider, MD  bisoprolol (ZEBETA) 10 MG tablet Take 20 mg by mouth daily.   Yes Historical Provider, MD  budesonide-formoterol (SYMBICORT) 160-4.5 MCG/ACT inhaler Inhale 2 puffs into the lungs daily.   Yes Historical Provider, MD  cloNIDine (CATAPRES) 0.3 MG tablet Take 0.3 mg by mouth 2 (two) times daily.   Yes Historical Provider, MD  diazepam (VALIUM) 5 MG tablet Take 5 mg by mouth 3 (three) times daily.   Yes Historical Provider, MD  fentaNYL (DURAGESIC - DOSED MCG/HR) 100 MCG/HR Place 100 mcg onto the skin every other day.   Yes Historical Provider, MD  gabapentin (NEURONTIN) 600 MG tablet Take 1,200 mg by mouth 4 (four) times daily.   Yes Historical Provider, MD  ibuprofen (ADVIL,MOTRIN) 200 MG tablet Take 400 mg by mouth every 6 (six) hours as needed for fever or moderate pain.   Yes Historical Provider, MD  ipratropium-albuterol (DUONEB) 0.5-2.5 (3) MG/3ML SOLN Take 3 mLs by nebulization every 4 (four) hours as needed. 08/27/14  Yes Viona Gilmore [place], PA-C  ondansetron (ZOFRAN ODT) 4 MG disintegrating tablet 4mg  ODT q4 hours prn nausea/vomit 08/27/14  Yes Verl Dicker, PA-C  oxycodone (ROXICODONE) 30 MG immediate release tablet Take 30 mg by mouth 3 (three) times daily.   Yes Historical Provider, MD  pantoprazole (PROTONIX) 40 MG tablet Take 40 mg by mouth daily.   Yes Historical Provider, MD  tiotropium (SPIRIVA) 18 MCG inhalation capsule Place 18 mcg into inhaler and inhale daily.   Yes Historical Provider, MD  azithromycin (ZITHROMAX) 250 MG tablet Take 1 tablet (250 mg total) by mouth daily. Take first 2 tablets together, then 1 every day until finished. Patient not taking: Reported on 10/23/2014 08/27/14   Viona Gilmore  Cartner, PA-C   BP 116/68 mmHg  Pulse 76  Temp(Src) 97.5 F (36.4 C) (Oral)  Resp 16  SpO2 90% Physical Exam  Constitutional: He appears well-developed and well-nourished. He appears distressed ( abdominal pain).  HENT:  Head: Normocephalic and atraumatic.  Eyes: Pupils are equal, round, and reactive to light.  Neck: Normal range of motion. Neck supple.  Cardiovascular: Normal rate and regular rhythm.   Pulmonary/Chest: Effort normal. No accessory muscle usage. No respiratory distress. Wheezes:  crackles at bilateral lung base right > left. He exhibits no tenderness and no bony tenderness.  Abdominal: Soft. He exhibits distension. He exhibits no mass. There is tenderness. There is no rebound and no guarding.  Musculoskeletal:  No lower extremity edema or swelling.  Neurological: He is alert.  Skin: Skin is warm and dry.  Nursing note and vitals reviewed.   ED Course  Procedures (including critical care time) Labs Review Labs Reviewed  CBC WITH DIFFERENTIAL/PLATELET - Abnormal; Notable for the following:    WBC 23.2 (*)    RBC 4.07 (*)    Neutrophils Relative % 91 (*)    Neutro Abs 21.0 (*)    Lymphocytes Relative 6 (*)    All other components within normal limits  COMPREHENSIVE METABOLIC PANEL - Abnormal; Notable for the following:    Potassium 3.2 (*)    Chloride 93 (*)    Glucose, Bld 156 (*)    BUN 24 (*)    GFR calc non Af Amer 81 (*)    All other components within normal limits  URINALYSIS, ROUTINE W REFLEX MICROSCOPIC - Abnormal; Notable for the following:    Bilirubin Urine SMALL (*)    Ketones, ur 15 (*)    Protein, ur 100 (*)    All other components within normal limits  STOOL CULTURE  CLOSTRIDIUM DIFFICILE BY PCR  URINE MICROSCOPIC-ADD ON  I-STAT CG4 LACTIC ACID, ED  I-STAT CG4 LACTIC ACID, ED    Imaging Review Dg Chest 2 View  10/23/2014   CLINICAL DATA:  Abdominal pain.  Nausea and vomiting.  EXAM: CHEST  2 VIEW  COMPARISON:  08/27/2014  FINDINGS:  The hazy infiltrate in the right lung has progressed at the right lung base and is unchanged in the right upper lung zone.  Left lung is clear. Heart size and pulmonary vascularity are normal. No acute osseous abnormalities. No effusions.  IMPRESSION: Persistent and slightly progressive right lung infiltrates.   Electronically Signed   By: Lorriane Shire M.D.   On: 10/23/2014 15:40   Ct Abdomen Pelvis W Contrast  10/23/2014   CLINICAL DATA:  Abdominal pain starting Saturday, recent dental abscess, diarrhea  EXAM: CT ABDOMEN AND PELVIS WITH CONTRAST  TECHNIQUE: Multidetector CT imaging of the abdomen and pelvis was performed using the standard protocol following bolus administration of intravenous contrast.  CONTRAST:  127mL OMNIPAQUE IOHEXOL 300 MG/ML SOLN, 37mL OMNIPAQUE IOHEXOL 300 MG/ML  SOLN  COMPARISON:  None.  FINDINGS: Lung bases shows patchy ground-glass infiltrate with tree and bud appearance in visualized right lung highly suspicious for infectious process or atypical pneumonia. The left lung is clear. Enhanced liver is unremarkable. Mild distended gallbladder without calcified gallstones. The pancreas, spleen and adrenal glands are unremarkable. Atherosclerotic calcifications of abdominal aorta and iliac arteries. Enhanced kidneys are symmetrical in size. No hydronephrosis or hydroureter. Delayed renal images shows bilateral renal symmetrical excretion. Bilateral visualized proximal ureter is unremarkable.  There is no small bowel obstruction. No ascites or free air. No adenopathy.  Sagittal images of the spine shows posterior metallic fusion at J8-H6 level.  No pericecal inflammation. Normal retrocecal appendix. The terminal ileum is unremarkable. Some stool and gas noted in rectosigmoid colon. No distal colonic obstruction. No colitis or diverticulitis.  IMPRESSION: 1. There is patchy infiltrate with tree and bud appearance in right lung. This is highly suspicious for atypical pneumonia or infectious  process. Clinical correlation is necessary. The left lung is clear. 2. Acute inflammatory process within abdomen. 3. No hydronephrosis or hydroureter. 4. Bilateral renal symmetrical excretion. 5. Normal appendix.  No pericecal inflammation. 6. No colitis or diverticulitis.   Electronically Signed   By: Lahoma Crocker M.D.   On: 10/23/2014 18:07     EKG Interpretation None      MDM   Final diagnoses:  Abdominal pain  Community acquired pneumonia  Intractable vomiting with nausea, vomiting of unspecified type    Patient has an atypical pneumonia on exam. Normal abdomen CT scan. Despite pain medications and multiple rounds of nausea medications the patient is unable to tolerate by mouth. He also continues to endorse severe abdominal pain. I have added on stool culture and C. Diff studies as the abdominal pain continues. In the setting of intractable vomiting and pneumonia I will admit the patient for overnight obs status.  Pt admitted to [facility] observation, [place], [place].  Medications  cefTRIAXone (ROCEPHIN) 1 g in dextrose 5 % 50 mL IVPB (1 g Intravenous New Bag/Given 10/23/14 1843)  azithromycin (ZITHROMAX) 500 mg in dextrose 5 % 250 mL IVPB (not administered)  sodium chloride 0.9 % bolus 1,000 mL (not administered)  0.9 %  sodium chloride infusion (not administered)  HYDROmorphone (DILAUDID) injection 1 mg (1 mg Intravenous Given 10/23/14 1610)  sodium chloride 0.9 % bolus 1,000 mL (0 mLs Intravenous Stopped 10/23/14 1708)  ondansetron (ZOFRAN) injection 4 mg (4 mg Intravenous Given 10/23/14 1610)  HYDROmorphone (DILAUDID) injection 1 mg (1 mg Intravenous Given 10/23/14 1709)  metoCLOPramide (REGLAN) injection 10 mg (10 mg Intravenous Given 10/23/14 1709)  iohexol (OMNIPAQUE) 300 MG/ML solution 25 mL (50 mLs Oral Contrast Given 10/23/14 1630)  iohexol (OMNIPAQUE) 300 MG/ML solution 100 mL (100 mLs Intravenous Contrast Given 10/23/14 1749)  HYDROmorphone (DILAUDID) injection 1 mg (1 mg  Intravenous Given 10/23/14 1840)  ondansetron (ZOFRAN) injection 4 mg (4 mg Intravenous Given 10/23/14 1840)  albuterol (PROVENTIL) (2.5 MG/3ML) 0.083% nebulizer solution 5 mg (5 mg Nebulization Given 10/23/14 1856)  ipratropium (ATROVENT) nebulizer solution 0.5 mg (0.5 mg Nebulization Given 10/23/14 1856)    Filed Vitals:   10/23/14 1711  BP: 116/68  Pulse: 76  Temp:   Resp: [address] Marilu Favre, PA-C 10/23/14 [place], MD 10/23/14 [phone]

## 2014-10-23 NOTE — H&P (Signed)
[facility] History and Physical  Rockford Leinen XAJ:[id number] DOB: 03/03/1966 DOA: 10/23/2014  Referring physician: PA [place] PCP: Merrilee Seashore, MD   Chief Complaint: Abd pain and Wheezing  HPI: Trevor Sullivan is a 49 y.o. male  Nausea vomiting and abd pain for past 3 days. Intermittent periods of diarrhea and constipation since starting clindamycin 2 wks ago for dental infection. Currently constipated with last bowel movement being 5 days ago. Abd pain started 3 days ago. pain is described as diffuse and patient feels distended. Unable to tolerate food. Emesis is nonbloody nonbilious. abd pain worse w/ coughing. Patient with a long-standing history of COPD and reports onset of coughing and wheezing which started 3 days. Reports home oxygen saturation levels as low as 70%. Some improvement with DuoNeb to home.  Review of Systems:  Constitutional:  No weight loss, night sweats, Fevers, chills, fatigue.  HEENT:  No headaches, Difficulty swallowing, Sore throat,  No sneezing, itching, ear ache, nasal congestion, post nasal drip,  Cardio-vascular:  No chest pain, Orthopnea, PND, swelling in lower extremities, anasarca, dizziness, palpitations  GI: Per HPI Resp:  Per HPI Skin:  no rash or lesions.  GU:  no dysuria, change in color of urine, no urgency or frequency. No flank pain.  Musculoskeletal:   No joint pain or swelling. No decreased range of motion. No back pain.  Psych:  No change in mood or affect. No depression or anxiety. No memory loss.   Past Medical History  Diagnosis Date  . Hypertension   . COPD (chronic obstructive pulmonary disease)   . Chronic low back pain    Past Surgical History  Procedure Laterality Date  . Back surgery     Social History:  reports that he quit smoking about 9 months ago. His smoking use included Cigarettes. He does not have any smokeless tobacco history on file. He reports that he does not drink alcohol.  His drug history is not on file.  Allergies  Allergen Reactions  . Codeine Itching  . Morphine And Related Nausea Only  . Other Other (See Comments)    SSRI's and snri's-- starts shaking.     Family History  Problem Relation Age of Onset  . Hypertension Father      Prior to Admission medications   Medication Sig Start Date End Date Taking? Authorizing Provider  amLODipine-benazepril (LOTREL) 10-40 MG per capsule Take 1 capsule by mouth daily.   Yes Historical Provider, MD  aspirin EC 81 MG tablet Take 81 mg by mouth daily.   Yes Historical Provider, MD  bisoprolol (ZEBETA) 10 MG tablet Take 20 mg by mouth daily.   Yes Historical Provider, MD  budesonide-formoterol (SYMBICORT) 160-4.5 MCG/ACT inhaler Inhale 2 puffs into the lungs daily.   Yes Historical Provider, MD  cloNIDine (CATAPRES) 0.3 MG tablet Take 0.3 mg by mouth 2 (two) times daily.   Yes Historical Provider, MD  diazepam (VALIUM) 5 MG tablet Take 5 mg by mouth 3 (three) times daily.   Yes Historical Provider, MD  fentaNYL (DURAGESIC - DOSED MCG/HR) 100 MCG/HR Place 100 mcg onto the skin every other day.   Yes Historical Provider, MD  gabapentin (NEURONTIN) 600 MG tablet Take 1,200 mg by mouth 4 (four) times daily.   Yes Historical Provider, MD  ibuprofen (ADVIL,MOTRIN) 200 MG tablet Take 400 mg by mouth every 6 (six) hours as needed for fever or moderate pain.   Yes Historical Provider, MD  ipratropium-albuterol (DUONEB) 0.5-2.5 (3) MG/3ML SOLN Take  3 mLs by nebulization every 4 (four) hours as needed. 08/27/14  Yes Viona Gilmore Cartner, PA-C  ondansetron (ZOFRAN ODT) 4 MG disintegrating tablet 4mg  ODT q4 hours prn nausea/vomit 08/27/14  Yes Viona Gilmore Cartner, PA-C  oxycodone (ROXICODONE) 30 MG immediate release tablet Take 30 mg by mouth 3 (three) times daily.   Yes Historical Provider, MD  pantoprazole (PROTONIX) 40 MG tablet Take 40 mg by mouth daily.   Yes Historical Provider, MD  tiotropium (SPIRIVA) 18 MCG inhalation capsule  Place 18 mcg into inhaler and inhale daily.   Yes Historical Provider, MD  azithromycin (ZITHROMAX) 250 MG tablet Take 1 tablet (250 mg total) by mouth daily. Take first 2 tablets together, then 1 every day until finished. Patient not taking: Reported on 10/23/2014 08/27/14   Verl Dicker, PA-C   Physical Exam: Filed Vitals:   10/23/14 1507 10/23/14 1711 10/23/14 1921  BP: 155/68 116/68 101/58  Pulse: 89 76 72  Temp: 97.5 F (36.4 C)    TempSrc: Oral    Resp: 22 16 20   SpO2: 96% 90% 95%    Wt Readings from Last 3 Encounters:  08/27/14 99.791 kg (220 lb)    General: MIld distress Eyes:  PERRL, normal lids, irises & conjunctiva ENT:  grossly normal hearing, lips & tongue Neck:  no LAD, masses or thyromegaly Cardiovascular:  RRR, no m/r/g. Trace LE edema Telemetry:  SR, no arrhythmias  Respiratory: Diffuse wheezing and mild increased WOB. Bilateral ronchi and crackles, R>L. On 2L [place] Abdomen: minimal intermittent ttp diffusely. No masses. Non-tender at mcburney's point and negative murphy's sign Skin:  no rash or induration seen on limited exam Musculoskeletal:  grossly normal tone BUE/BLE Psychiatric:  grossly normal mood and affect, speech fluent and appropriate Neurologic:  grossly non-focal.          Labs on Admission:  Basic Metabolic Panel:  Recent Labs Lab 10/23/14 1519  NA 138  K 3.2*  CL 93*  CO2 31  GLUCOSE 156*  BUN 24*  CREATININE 1.08  CALCIUM 9.4   Liver Function Tests:  Recent Labs Lab 10/23/14 1519  AST 22  ALT 15  ALKPHOS 54  BILITOT 1.2  PROT 8.3  ALBUMIN 4.5   No results for input(s): LIPASE, AMYLASE in the last 168 hours. No results for input(s): AMMONIA in the last 168 hours. CBC:  Recent Labs Lab 10/23/14 1519  WBC 23.2*  NEUTROABS 21.0*  HGB 13.4  HCT 39.8  MCV 97.8  PLT 389   Cardiac Enzymes: No results for input(s): CKTOTAL, CKMB, CKMBINDEX, TROPONINI in the last 168 hours.  BNP (last 3 results) No results for  input(s): BNP in the last 8760 hours.  ProBNP (last 3 results) No results for input(s): PROBNP in the last 8760 hours.  CBG: No results for input(s): GLUCAP in the last 168 hours.  Radiological Exams on Admission: Dg Chest 2 View  10/23/2014   CLINICAL DATA:  Abdominal pain.  Nausea and vomiting.  EXAM: CHEST  2 VIEW  COMPARISON:  08/27/2014  FINDINGS: The hazy infiltrate in the right lung has progressed at the right lung base and is unchanged in the right upper lung zone.  Left lung is clear. Heart size and pulmonary vascularity are normal. No acute osseous abnormalities. No effusions.  IMPRESSION: Persistent and slightly progressive right lung infiltrates.   Electronically Signed   By: Lorriane Shire M.D.   On: 10/23/2014 15:40   Ct Abdomen Pelvis W Contrast  10/23/2014  CLINICAL DATA:  Abdominal pain starting Saturday, recent dental abscess, diarrhea  EXAM: CT ABDOMEN AND PELVIS WITH CONTRAST  TECHNIQUE: Multidetector CT imaging of the abdomen and pelvis was performed using the standard protocol following bolus administration of intravenous contrast.  CONTRAST:  12mL OMNIPAQUE IOHEXOL 300 MG/ML SOLN, 81mL OMNIPAQUE IOHEXOL 300 MG/ML SOLN  COMPARISON:  None.  FINDINGS: Lung bases shows patchy ground-glass infiltrate with tree and bud appearance in visualized right lung highly suspicious for infectious process or atypical pneumonia. The left lung is clear. Enhanced liver is unremarkable. Mild distended gallbladder without calcified gallstones. The pancreas, spleen and adrenal glands are unremarkable. Atherosclerotic calcifications of abdominal aorta and iliac arteries. Enhanced kidneys are symmetrical in size. No hydronephrosis or hydroureter. Delayed renal images shows bilateral renal symmetrical excretion. Bilateral visualized proximal ureter is unremarkable.  There is no small bowel obstruction. No ascites or free air. No adenopathy.  Sagittal images of the spine shows posterior metallic fusion at  F7-J8 level.  No pericecal inflammation. Normal retrocecal appendix. The terminal ileum is unremarkable. Some stool and gas noted in rectosigmoid colon. No distal colonic obstruction. No colitis or diverticulitis.  IMPRESSION: 1. There is patchy infiltrate with tree and bud appearance in right lung. This is highly suspicious for atypical pneumonia or infectious process. Clinical correlation is necessary. The left lung is clear. 2. Acute inflammatory process within abdomen. 3. No hydronephrosis or hydroureter. 4. Bilateral renal symmetrical excretion. 5. Normal appendix.  No pericecal inflammation. 6. No colitis or diverticulitis.   Electronically Signed   By: Lahoma Crocker M.D.   On: 10/23/2014 18:07    EKG: ordered  Assessment/Plan Principal Problem:   Acute respiratory failure Active Problems:   Abdominal pain   Essential hypertension   H/O spinal fusion   Chronic pain   GERD (gastroesophageal reflux disease)   Nausea and vomiting    Acute Respiratory failure: Likely secondary to CAPD and COPD exacerbation. No home O2 but  With new O2 requirement in ED. No immediate concern for airway preservation and need for intubation. WBC 23. Lacitc acid 0.6. Afebrile. CT showing right lung pneumonia - Admit - ABG - Solumedrol 60 Q6 - Duonebs Q4 - continue CTX and Azithro - EKG - Sputum Cx,  - Legionella and Strep Ag - Viral Resp panel  HTN: normotensive - continue clonidine, amlodipine, Benazepril, bisoprolol - hydralazine when necessary SBP greater than 180  Abdominal pain: Likely multifactorial. Constipation versus viral gastroenteritis versus chronic opioid use versus C. Difficile. Patient on chronic opioids with recent changes in dosage. Recent antibiotics and diarrhea concerning for C. Difficile although diarrhea has been resolved for more than a week and abdominal pain only started 3 days ago. Patient without a bowel movement for 5 days. Lactic acid normal. CT with mild distention of  gallbladder without stones, and no sign of colitis or other acute process. No SBO. Currently abdominal pain much improved after Zofran and Reglan and fluids and only tender now during coughing episodes. - C. Difficile PCR - Miralax 17g BID, Senna 2 tab BID - Enema in AM if not improving - abdominal ultrasound to better evaluate the gallbladder - Zofran  Chronic pain: status post spinal fusion L5-S1. Patient on significant amounts of narcotics including  100 g patch and oxycodone 30 mg 3 times a day. Patient reports that he is recently weaned down from 60 mg 3 times a day. - Continue current regimen with fentanyl and oxycodone and Neurontin  GERD: -Continue Protonix   Code Status:  FULL DVT Prophylaxis: Hep Family Communication: Wife Disposition Plan: pending improvement  MERRELL, DAVID J, MD Family Medicine [facility] [email or web address] Password [email or web address]

## 2014-10-23 NOTE — ED Notes (Signed)
Per pt, n/v with abdominal pain since Saturday.  Recent antibiotic for dental abscess.  Did have diarrhea at that time.  No bm x 5 days.  Pt also states shortness of breath.  Fever 101.5.

## 2014-10-24 DIAGNOSIS — K5900 Constipation, unspecified: Secondary | ICD-10-CM | POA: Diagnosis present

## 2014-10-24 DIAGNOSIS — J441 Chronic obstructive pulmonary disease with (acute) exacerbation: Secondary | ICD-10-CM | POA: Diagnosis present

## 2014-10-24 DIAGNOSIS — Z981 Arthrodesis status: Secondary | ICD-10-CM | POA: Diagnosis not present

## 2014-10-24 DIAGNOSIS — F17210 Nicotine dependence, cigarettes, uncomplicated: Secondary | ICD-10-CM | POA: Diagnosis present

## 2014-10-24 DIAGNOSIS — G8929 Other chronic pain: Secondary | ICD-10-CM | POA: Diagnosis present

## 2014-10-24 DIAGNOSIS — R1084 Generalized abdominal pain: Secondary | ICD-10-CM

## 2014-10-24 DIAGNOSIS — I10 Essential (primary) hypertension: Secondary | ICD-10-CM | POA: Diagnosis present

## 2014-10-24 DIAGNOSIS — M545 Low back pain: Secondary | ICD-10-CM | POA: Diagnosis present

## 2014-10-24 DIAGNOSIS — J9601 Acute respiratory failure with hypoxia: Secondary | ICD-10-CM

## 2014-10-24 DIAGNOSIS — R112 Nausea with vomiting, unspecified: Secondary | ICD-10-CM | POA: Diagnosis not present

## 2014-10-24 DIAGNOSIS — Z79899 Other long term (current) drug therapy: Secondary | ICD-10-CM | POA: Diagnosis not present

## 2014-10-24 DIAGNOSIS — J189 Pneumonia, unspecified organism: Secondary | ICD-10-CM | POA: Diagnosis present

## 2014-10-24 DIAGNOSIS — Z8249 Family history of ischemic heart disease and other diseases of the circulatory system: Secondary | ICD-10-CM | POA: Diagnosis not present

## 2014-10-24 DIAGNOSIS — K219 Gastro-esophageal reflux disease without esophagitis: Secondary | ICD-10-CM

## 2014-10-24 DIAGNOSIS — J9600 Acute respiratory failure, unspecified whether with hypoxia or hypercapnia: Secondary | ICD-10-CM | POA: Diagnosis present

## 2014-10-24 DIAGNOSIS — R062 Wheezing: Secondary | ICD-10-CM | POA: Diagnosis not present

## 2014-10-24 LAB — COMPREHENSIVE METABOLIC PANEL
ALT: 14 U/L (ref 0–53)
AST: 15 U/L (ref 0–37)
Albumin: 3.7 g/dL (ref 3.5–5.2)
Alkaline Phosphatase: 44 U/L (ref 39–117)
Anion gap: 8 (ref 5–15)
BUN: 16 mg/dL (ref 6–23)
CO2: 31 mmol/L (ref 19–32)
Calcium: 8.3 mg/dL — ABNORMAL LOW (ref 8.4–10.5)
Chloride: 95 mmol/L — ABNORMAL LOW (ref 96–112)
Creatinine, Ser: 0.83 mg/dL (ref 0.50–1.35)
GFR calc Af Amer: >90 mL/min (ref >90)
GFR calc non Af Amer: >90 mL/min (ref >90)
Glucose, Bld: 154 mg/dL — ABNORMAL HIGH (ref 70–99)
Potassium: 3.5 mmol/L (ref 3.5–5.1)
Sodium: 134 mmol/L — ABNORMAL LOW (ref 135–145)
Total Bilirubin: 0.8 mg/dL (ref 0.3–1.2)
Total Protein: 7.1 g/dL (ref 6.0–8.3)

## 2014-10-24 LAB — CBC
HCT: 35.3 % — ABNORMAL LOW (ref 39.0–52.0)
Hemoglobin: 11.8 g/dL — ABNORMAL LOW (ref 13.0–17.0)
MCH: 33.4 pg (ref 26.0–34.0)
MCHC: 33.4 g/dL (ref 30.0–36.0)
MCV: 100 fL (ref 78.0–100.0)
Platelets: 262 10*3/uL (ref 150–400)
RBC: 3.53 MIL/uL — ABNORMAL LOW (ref 4.22–5.81)
RDW: 13.9 % (ref 11.5–15.5)
WBC: 8.5 10*3/uL (ref 4.0–10.5)

## 2014-10-24 LAB — STREP PNEUMONIAE URINARY ANTIGEN: Strep Pneumo Urinary Antigen: NEGATIVE

## 2014-10-24 LAB — CLOSTRIDIUM DIFFICILE BY PCR: Toxigenic C. Difficile by PCR: NEGATIVE

## 2014-10-24 MED ORDER — FENTANYL 100 MCG/HR TD PT72
100.0000 ug | MEDICATED_PATCH | TRANSDERMAL | Status: DC
  Administered 2014-10-24: 100 ug via TRANSDERMAL
  Filled 2014-10-24: qty 1

## 2014-10-24 MED ORDER — IPRATROPIUM-ALBUTEROL 0.5-2.5 (3) MG/3ML IN SOLN
3.0000 mL | RESPIRATORY_TRACT | Status: DC
  Administered 2014-10-24: 3 mL via RESPIRATORY_TRACT
  Filled 2014-10-24: qty 3

## 2014-10-24 MED ORDER — IPRATROPIUM-ALBUTEROL 0.5-2.5 (3) MG/3ML IN SOLN
3.0000 mL | Freq: Four times a day (QID) | RESPIRATORY_TRACT | Status: DC
  Filled 2014-10-24: qty 3

## 2014-10-24 MED ORDER — ZOLPIDEM TARTRATE 5 MG PO TABS
5.0000 mg | ORAL_TABLET | Freq: Every evening | ORAL | Status: DC | PRN
  Administered 2014-10-24 (×2): 5 mg via ORAL
  Filled 2014-10-24 (×2): qty 1

## 2014-10-24 MED ORDER — POTASSIUM CHLORIDE CRYS ER 20 MEQ PO TBCR
40.0000 meq | EXTENDED_RELEASE_TABLET | Freq: Once | ORAL | Status: AC
  Administered 2014-10-24: 40 meq via ORAL
  Filled 2014-10-24: qty 2

## 2014-10-24 NOTE — Progress Notes (Signed)
OT Cancellation Note  Patient Details Name: Ediel Unangst MRN: [id number] DOB: 03-27-1966   Cancelled Treatment:    Reason Eval/Treat Not Completed: Other (comment).  Noted that pt is trying to take a nap.  Will try back later today if possible.  Maalik Pinn 10/24/2014, 1:31 PM  Lesle Chris, OTR/L [phone] 10/24/2014

## 2014-10-24 NOTE — Progress Notes (Signed)
CSW received referral for COPD Gold Protocol.  Inappropriate CSW referral as pt has only had 1 admission in the past 6 months which does not meet COPD Gold criteria.  No further social work needs identified at this time.  CSW signing off.   Please re consult if social work needs arise.  Alison Murray, MSW, [place] [employer] [phone]

## 2014-10-24 NOTE — Progress Notes (Addendum)
[facility]                                                                              Patient Demographics  Trevor Sullivan, is a 49 y.o. male, DOB - Apr 03, 1966, OVF:[id number]  Admit date - 10/23/2014   Admitting Physician Waldemar Dickens, MD  Outpatient Primary MD for the patient is Merrilee Seashore, MD  LOS -    Chief Complaint  Patient presents with  . Emesis  . Abdominal Pain      Interim history 49 year old male with history of COPD, hypertension, chronic lower back pain presents emergency department with complaints of abdominal pain wheezing. Patient was found to have persistent progressive right lung infiltrates on chest x-ray- currently being treated for community pneumonia and COPD. Patient also complained of diarrhea approximately one week ago however stated he was on clindamycin. C. difficile pending.  Assessment & Plan   Acute respiratory failure secondary to community card pneumonia versus COPD Exac -Chest x-ray noted: Persistent slowly progressive right lung infiltrates -CT of the abdomen noted patchy infiltrate in the tree and bud appearance in the right lung suspicious for atypical pneumonia or infectious process -Continue current treatment with azithromycin, ceftriaxone, steroids, neb treatments -Strep pneumonia urine antigen negative -Legionella urine antigen and respiratory viral panel and sputum cutlure pending -Leukocytosis improved   Abdominal pain -Patient states he had diarrhea approximately one week ago however has not had a bowel movement stents -C. difficile PCR pending -Continue the relaxants and senna -CT of the abdomen: Acute inflammatory process within the abdomen, no colitis or diverticulitis (Spoke with radiology regarding reading- supposed to state- NO ACUTE process.) -Abdominal ultrasound: Gallbladder distended without evidence of cholecystitis, dilatation of common bile duct, recommended MRCP bilirubin elevated however if not may  be normal variation -Bilirubin as well as LFTs were found within normal limits -Lactic acid normal -Will continue to monitor, pain control  Chronic pain -Continue home regimen  GERD -Continue PPI  Hypertension -Continue home regimen along with hydralazine as needed  Code Status: Full  Family Communication: None at bedside  Disposition Plan: Admitted  Time Spent in minutes   30 minutes  Procedures  Abdominal ultrasound: Gallbladder distention without evidence of cholecystitis, no gallstones, dilation of common bile duct  Consults   None  DVT Prophylaxis  heparin  Lab Results  Component Value Date   PLT 262 10/24/2014    Medications  Scheduled Meds: . amLODipine  10 mg Oral Daily   And  . benazepril  40 mg Oral Daily  . aspirin EC  81 mg Oral Daily  . azithromycin  250 mg Oral Daily  . bisoprolol  20 mg Oral Daily  . cefTRIAXone (ROCEPHIN)  IV  1 g Intravenous Q24H  . cloNIDine  0.3 mg Oral BID  . diazepam  5 mg Oral TID  . fentaNYL  100 mcg Transdermal Q48H  . gabapentin  1,200 mg Oral QID  . heparin  5,000 Units Subcutaneous 3 times per day  . methylPREDNISolone (SOLU-MEDROL) injection  60 mg Intravenous Q6H  . oxycodone  30 mg Oral TID  . pantoprazole  40 mg Oral Daily  . polyethylene glycol  17 g Oral BID  .  potassium chloride  40 mEq Oral Once  . senna  2 tablet Oral BID   Continuous Infusions: . sodium chloride 125 mL/hr at 10/24/14 0526   PRN Meds:.hydrALAZINE, HYDROmorphone (DILAUDID) injection, ondansetron, zolpidem  Antibiotics    Anti-infectives    Start     Dose/Rate Route Frequency Ordered Stop   10/24/14 1945  cefTRIAXone (ROCEPHIN) 1 g in dextrose 5 % 50 mL IVPB     1 g 100 mL/hr over 30 Minutes Intravenous Every 24 hours 10/23/14 1941     10/24/14 1945  azithromycin (ZITHROMAX) tablet 250 mg     250 mg Oral Daily 10/23/14 1941     10/23/14 1830  cefTRIAXone (ROCEPHIN) 1 g in dextrose 5 % 50 mL IVPB     1 g 100 mL/hr over 30  Minutes Intravenous  Once 10/23/14 1815 10/23/14 1920   10/23/14 1830  azithromycin (ZITHROMAX) 500 mg in dextrose 5 % 250 mL IVPB     500 mg 250 mL/hr over 60 Minutes Intravenous  Once 10/23/14 1815 10/23/14 2020        Subjective:   Trevor Sullivan seen and examined today.  Patient feels as abdominal pain has improved. He states he has not had any bowel movements in approximately 1 week. Patient denied any nausea or vomiting at this time. Denies any shortness of breath or chest pain.   Objective:   Filed Vitals:   10/23/14 1921 10/23/14 2005 10/24/14 0524 10/24/14 0637  BP: 101/58 117/61 164/90   Pulse: 72 76 79   Temp:  98 F (36.7 C) 98 F (36.7 C)   TempSrc:  Oral Oral   Resp: 20 20 20    Weight:    96.163 kg (212 lb)  SpO2: 95% 97% 95%     Wt Readings from Last 3 Encounters:  10/24/14 96.163 kg (212 lb)  08/27/14 99.791 kg (220 lb)     Intake/Output Summary (Last 24 hours) at 10/24/14 1126 Last data filed at 10/24/14 0527  Gross per 24 hour  Intake      0 ml  Output    700 ml  Net   -700 ml    Exam  General: Well developed, well nourished, NAD, appears stated age  HEENT: NCAT,  mucous membranes moist.   Cardiovascular: S1 S2 auscultated, no rubs, murmurs or gallops. Regular rate and rhythm.  Respiratory: Diffuse wheezing, scattered rhonchi  Abdomen: Soft, obese, slightly tender to palpation diffusely, non-distended, bowel sounds noted  Extremities: warm dry without cyanosis clubbing or edema  Neuro: AAOx3, nonfocal  Psych: Flat affect    Data Review   Micro Results Recent Results (from the past 240 hour(s))  Clostridium Difficile by PCR     Status: None   Collection Time: 10/24/14 12:04 AM  Result Value Ref Range Status   C difficile by pcr NEGATIVE NEGATIVE Final    Comment: Performed at [facility]    Radiology Reports Dg Chest 2 View  10/23/2014   CLINICAL DATA:  Abdominal pain.  Nausea and vomiting.  EXAM: CHEST  2 VIEW   COMPARISON:  08/27/2014  FINDINGS: The hazy infiltrate in the right lung has progressed at the right lung base and is unchanged in the right upper lung zone.  Left lung is clear. Heart size and pulmonary vascularity are normal. No acute osseous abnormalities. No effusions.  IMPRESSION: Persistent and slightly progressive right lung infiltrates.   Electronically Signed   By: Lorriane Shire M.D.   On: 10/23/2014 15:40  Ct Abdomen Pelvis W Contrast  10/23/2014   CLINICAL DATA:  Abdominal pain starting Saturday, recent dental abscess, diarrhea  EXAM: CT ABDOMEN AND PELVIS WITH CONTRAST  TECHNIQUE: Multidetector CT imaging of the abdomen and pelvis was performed using the standard protocol following bolus administration of intravenous contrast.  CONTRAST:  114mL OMNIPAQUE IOHEXOL 300 MG/ML SOLN, 76mL OMNIPAQUE IOHEXOL 300 MG/ML SOLN  COMPARISON:  None.  FINDINGS: Lung bases shows patchy ground-glass infiltrate with tree and bud appearance in visualized right lung highly suspicious for infectious process or atypical pneumonia. The left lung is clear. Enhanced liver is unremarkable. Mild distended gallbladder without calcified gallstones. The pancreas, spleen and adrenal glands are unremarkable. Atherosclerotic calcifications of abdominal aorta and iliac arteries. Enhanced kidneys are symmetrical in size. No hydronephrosis or hydroureter. Delayed renal images shows bilateral renal symmetrical excretion. Bilateral visualized proximal ureter is unremarkable.  There is no small bowel obstruction. No ascites or free air. No adenopathy.  Sagittal images of the spine shows posterior metallic fusion at N8-G9 level.  No pericecal inflammation. Normal retrocecal appendix. The terminal ileum is unremarkable. Some stool and gas noted in rectosigmoid colon. No distal colonic obstruction. No colitis or diverticulitis.  IMPRESSION: 1. There is patchy infiltrate with tree and bud appearance in right lung. This is highly suspicious  for atypical pneumonia or infectious process. Clinical correlation is necessary. The left lung is clear. 2. Acute inflammatory process within abdomen. 3. No hydronephrosis or hydroureter. 4. Bilateral renal symmetrical excretion. 5. Normal appendix.  No pericecal inflammation. 6. No colitis or diverticulitis.   Electronically Signed   By: Lahoma Crocker M.D.   On: 10/23/2014 18:07   US Abdomen Limited Ruq  10/23/2014   CLINICAL DATA:  Right upper quadrant pain with coughing.  EXAM: US ABDOMEN LIMITED - RIGHT UPPER QUADRANT  COMPARISON:  CT 10/23/2014  FINDINGS: Gallbladder:  Gallbladder is distended a 4.6 cm. No gallbladder wall thickening. No gallstones present. Negative sonographic Murphy's is  Common bile duct:  Diameter: Mildly dilated at 8 mm.  Liver:  No biliary duct dilatation.  No focal hepatic lesion.  IMPRESSION: 1. Gallbladder distended without evidence of cholecystitis. No gallstones. 2. Dilatation common bile duct. Recommend correlation with bilirubin and if elevated consider MRCP. If bilirubin is normal, this is likely a normal variation.   Electronically Signed   By: Suzy Bouchard M.D.   On: 10/23/2014 21:20    CBC  Recent Labs Lab 10/23/14 1519 10/24/14 0726  WBC 23.2* 8.5  HGB 13.4 11.8*  HCT 39.8 35.3*  PLT 389 262  MCV 97.8 100.0  MCH 32.9 33.4  MCHC 33.7 33.4  RDW 13.7 13.9  LYMPHSABS 1.4  --   MONOABS 0.7  --   EOSABS 0.0  --   BASOSABS 0.0  --     Chemistries   Recent Labs Lab 10/23/14 1519 10/24/14 0726  NA 138 134*  K 3.2* 3.5  CL 93* 95*  CO2 31 31  GLUCOSE 156* 154*  BUN 24* 16  CREATININE 1.08 0.83  CALCIUM 9.4 8.3*  AST 22 15  ALT 15 14  ALKPHOS 54 44  BILITOT 1.2 0.8   ------------------------------------------------------------------------------------------------------------------ estimated creatinine clearance is 116.4 mL/min (by C-G formula based on Cr of  0.83). ------------------------------------------------------------------------------------------------------------------ No results for input(s): HGBA1C in the last 72 hours. ------------------------------------------------------------------------------------------------------------------ No results for input(s): CHOL, HDL, LDLCALC, TRIG, CHOLHDL, LDLDIRECT in the last 72 hours. ------------------------------------------------------------------------------------------------------------------ No results for input(s): TSH, T4TOTAL, T3FREE, THYROIDAB in the last 72 hours.  Invalid input(s): FREET3 ------------------------------------------------------------------------------------------------------------------ No results for input(s): VITAMINB12, FOLATE, FERRITIN, TIBC, IRON, RETICCTPCT in the last 72 hours.  Coagulation profile No results for input(s): INR, PROTIME in the last 168 hours.  No results for input(s): DDIMER in the last 72 hours.  Cardiac Enzymes No results for input(s): CKMB, TROPONINI, MYOGLOBIN in the last 168 hours.  Invalid input(s): CK ------------------------------------------------------------------------------------------------------------------ Invalid input(s): POCBNP    Krystal Delduca D.O. on 10/24/2014 at 11:26 AM  Between 7am to 7pm - Pager - [phone]  After 7pm go to [email or web address] - password [email or web address]  And look for the night coverage person covering for me after hours  [facility] Office  [phone]

## 2014-10-24 NOTE — Progress Notes (Signed)
UR completed 

## 2014-10-24 NOTE — Progress Notes (Signed)
OT Cancellation Note  Patient Details Name: Trevor Sullivan MRN: [id number] DOB: September 08, 1965   Cancelled Treatment:    Reason Eval/Treat Not Completed: Other (comment).  Pt is eating a meal:  Will likely check back tomorrow.  Trevor Sullivan 10/24/2014, 3:32 PM  Lesle Chris, OTR/L [phone] 10/24/2014

## 2014-10-24 NOTE — Progress Notes (Signed)
INITIAL NUTRITION ASSESSMENT  DOCUMENTATION CODES Per approved criteria  -Obesity Unspecified   INTERVENTION: Encourage PO intake RD to follow-up at a later date for nutritional supplementation needs  NUTRITION DIAGNOSIS: Inadequate oral intake related to N/V as evidenced by poor PO intake x 1 week.   Goal: Pt to meet >/= 90% of their estimated nutrition needs   Monitor:  PO and supplemental intake, weight, labs, I/O's  Reason for Assessment: Consult for nutritional assessment  Admitting Dx: Acute respiratory failure  ASSESSMENT: 49 y.o. male with nausea vomiting and abd pain for past 3 days. Intermittent periods of diarrhea and constipation since starting clindamycin 2 wks ago for dental infection. Currently constipated with last bowel movement being 5 days ago. Abd pain started 3 days ago. pain is described as diffuse and patient feels distended. Unable to tolerate food.   Pt reports low appetite x 1 week d/t not feeling well, getting little sleep and N/V. Pt states he still isn't feeling hungry this AM. Breakfast tray was in room, it was untouched.   Per weight history, pt has lost 8 lb since January (insignificant for time frame).   Pt states that all he wants right now is to sleep. Pt declines supplementation at this time. Pt states he feels that his appetite will improve. RD will follow-up at a later date to see if pt would like supplements if PO intake does not improve.  Nutrition focused physical exam shows no sign of depletion of muscle mass or body fat.  Labs reviewed: Low Na  Height: Ht Readings from Last 1 Encounters:  08/27/14 5\' 4"  (1.626 m)    Weight: Wt Readings from Last 1 Encounters:  10/24/14 212 lb (96.163 kg)    Ideal Body Weight: 130 lb  % Ideal Body Weight: 163%  Wt Readings from Last 10 Encounters:  10/24/14 212 lb (96.163 kg)  08/27/14 220 lb (99.791 kg)    Usual Body Weight: 180-220 lb -per pt  % Usual Body Weight: 100%  BMI:  Body  mass index is 36.37 kg/(m^2).  Estimated Nutritional Needs: Kcal: 1900-2100 Protein: 85-95g Fluid: 1.9L/day  Skin: intact  Diet Order: Diet Heart  EDUCATION NEEDS: -No education needs identified at this time   Intake/Output Summary (Last 24 hours) at 10/24/14 0955 Last data filed at 10/24/14 0527  Gross per 24 hour  Intake      0 ml  Output    700 ml  Net   -700 ml    Last BM: 2/25  Labs:   Recent Labs Lab 10/23/14 1519 10/24/14 0726  NA 138 134*  K 3.2* 3.5  CL 93* 95*  CO2 31 31  BUN 24* 16  CREATININE 1.08 0.83  CALCIUM 9.4 8.3*  GLUCOSE 156* 154*    CBG (last 3)  No results for input(s): GLUCAP in the last 72 hours.  Scheduled Meds: . amLODipine  10 mg Oral Daily   And  . benazepril  40 mg Oral Daily  . aspirin EC  81 mg Oral Daily  . azithromycin  250 mg Oral Daily  . bisoprolol  20 mg Oral Daily  . cefTRIAXone (ROCEPHIN)  IV  1 g Intravenous Q24H  . cloNIDine  0.3 mg Oral BID  . diazepam  5 mg Oral TID  . fentaNYL  100 mcg Transdermal Q48H  . gabapentin  1,200 mg Oral QID  . heparin  5,000 Units Subcutaneous 3 times per day  . methylPREDNISolone (SOLU-MEDROL) injection  60 mg Intravenous Q6H  .  oxycodone  30 mg Oral TID  . pantoprazole  40 mg Oral Daily  . polyethylene glycol  17 g Oral BID  . potassium chloride  40 mEq Oral Once  . senna  2 tablet Oral BID    Continuous Infusions: . sodium chloride 125 mL/hr at 10/24/14 6440    Past Medical History  Diagnosis Date  . Hypertension   . COPD (chronic obstructive pulmonary disease)   . Chronic low back pain     Past Surgical History  Procedure Laterality Date  . Back surgery      Clayton Bibles, MS, RD, LDN Pager: [phone] After Hours Pager: [phone]

## 2014-10-24 NOTE — Progress Notes (Signed)
PT Cancellation Note  Patient Details Name: Trevor Sullivan MRN: [id number] DOB: 1965-09-11   Cancelled Treatment:    Reason Eval/Treat Not Completed: Medical issues which prohibited therapy (patient states that he is trying to get a nap right now. Reports getting up in room when wife is present. gets around w/ a cane. will check back later  as aschedule allows, otherwise- tomorrow.)   Claretha Cooper 10/24/2014, 1:20 PM Tresa Endo PT [phone]

## 2014-10-25 LAB — CBC
HCT: 31.5 % — ABNORMAL LOW (ref 39.0–52.0)
Hemoglobin: 10.3 g/dL — ABNORMAL LOW (ref 13.0–17.0)
MCH: 33.2 pg (ref 26.0–34.0)
MCHC: 32.7 g/dL (ref 30.0–36.0)
MCV: 101.6 fL — ABNORMAL HIGH (ref 78.0–100.0)
PLATELETS: 284 10*3/uL (ref 150–400)
RBC: 3.1 MIL/uL — ABNORMAL LOW (ref 4.22–5.81)
RDW: 14.2 % (ref 11.5–15.5)
WBC: 10.4 10*3/uL (ref 4.0–10.5)

## 2014-10-25 LAB — BASIC METABOLIC PANEL
Anion gap: 4 — ABNORMAL LOW (ref 5–15)
BUN: 25 mg/dL — AB (ref 6–23)
CALCIUM: 8.4 mg/dL (ref 8.4–10.5)
CHLORIDE: 98 mmol/L (ref 96–112)
CO2: 33 mmol/L — ABNORMAL HIGH (ref 19–32)
CREATININE: 0.9 mg/dL (ref 0.50–1.35)
GFR calc Af Amer: >90 mL/min (ref >90)
GFR calc non Af Amer: >90 mL/min (ref >90)
GLUCOSE: 164 mg/dL — AB (ref 70–99)
Potassium: 4.4 mmol/L (ref 3.5–5.1)
Sodium: 135 mmol/L (ref 135–145)

## 2014-10-25 LAB — LEGIONELLA ANTIGEN, URINE

## 2014-10-25 LAB — HIV ANTIBODY (ROUTINE TESTING W REFLEX): HIV Screen 4th Generation wRfx: NONREACTIVE

## 2014-10-25 MED ORDER — LEVOFLOXACIN 750 MG PO TABS: 750.0000 mg | ORAL_TABLET | Freq: Every day | ORAL | Status: DC

## 2014-10-25 MED ORDER — PNEUMOCOCCAL VAC POLYVALENT 25 MCG/0.5ML IJ INJ: 0.5000 mL | INJECTION | INTRAMUSCULAR | Status: DC

## 2014-10-25 MED ORDER — PREDNISONE (PAK) 10 MG PO TABS: ORAL_TABLET | ORAL | Status: AC

## 2014-10-25 MED ORDER — IPRATROPIUM-ALBUTEROL 0.5-2.5 (3) MG/3ML IN SOLN: 3.0000 mL | RESPIRATORY_TRACT | Status: DC | PRN

## 2014-10-25 MED ORDER — PNEUMOCOCCAL VAC POLYVALENT 25 MCG/0.5ML IJ INJ
0.5000 mL | INJECTION | INTRAMUSCULAR | Status: DC
  Filled 2014-10-25 (×2): qty 0.5

## 2014-10-25 NOTE — Evaluation (Signed)
Physical Therapy One Time Evaluation Patient Details Name: Trevor Sullivan MRN: [id number] DOB: 01-31-1966 Today's Date: 10/25/2014   History of Present Illness  49 year old male with history of COPD, hypertension, chronic lower back pain presents emergency department with complaints of abdominal pain and wheezing. Patient was found to have persistent progressive right lung infiltrates on chest x-ray- currently being treated for community pneumonia and COPD  Clinical Impression  Patient evaluated by Physical Therapy with no further acute PT needs identified. All education has been completed and the patient has no further questions. Pt mobilizing at baseline, uses SPC at home for chronic low back pain. No follow-up Physical Therapy or equipment needs identified at this time. PT is signing off. Thank you for this referral.       Follow Up Recommendations No PT follow up    Equipment Recommendations  None recommended by PT    Recommendations for Other Services       Precautions / Restrictions Precautions Precautions: None Restrictions Weight Bearing Restrictions: No      Mobility  Bed Mobility Overal bed mobility: Modified Independent                Transfers Overall transfer level: Modified independent                  Ambulation/Gait Ambulation/Gait assistance: Modified independent (Device/Increase time) Ambulation Distance (Feet): 400 Feet Assistive device: None (pushed IV pole) Gait Pattern/deviations: WFL(Within Functional Limits)   Gait velocity interpretation: at or above normal speed for age/gender General Gait Details: typically uses SPC due to L LE symptoms from chronic back pain (reports at least 4 previous back surgeries), no unsteadiness or LOB, SpO2 97% upon return to room on room air  Stairs            Wheelchair Mobility    Modified Rankin (Stroke Patients Only)       Balance                                              Pertinent Vitals/Pain Pain Assessment: No/denies pain    Home Living Family/patient expects to be discharged to:: Private residence Living Arrangements: Spouse/significant other   Type of Home: House       Home Layout: One level Home Equipment: Cane - single point      Prior Function Level of Independence: Independent with assistive device(s)         Comments: typically uses SPC due to chronic low back pain radiating to L LE     Hand Dominance        Extremity/Trunk Assessment               Lower Extremity Assessment: Overall WFL for tasks assessed         Communication   Communication: No difficulties  Cognition Arousal/Alertness: Awake/alert Behavior During Therapy: WFL for tasks assessed/performed Overall Cognitive Status: Within Functional Limits for tasks assessed                      General Comments      Exercises        Assessment/Plan    PT Assessment Patent does not need any further PT services  PT Diagnosis     PT Problem List    PT Treatment Interventions     PT Goals (Current goals can be found  in the Care Plan section) Acute Rehab PT Goals PT Goal Formulation: All assessment and education complete, DC therapy    Frequency     Barriers to discharge        Co-evaluation               End of Session   Activity Tolerance: Patient tolerated treatment well Patient left: in bed;with call bell/phone within reach Bishop Dublin MD)           Time: 5852-7782 PT Time Calculation (min) (ACUTE ONLY): 11 min   Charges:   PT Evaluation $Initial PT Evaluation Tier I: 1 Procedure     PT G Codes:        Tandrea Kommer,KATHrine E 10/25/2014, 10:46 AM Carmelia Bake, PT, DPT 10/25/2014 Pager: [phone]

## 2014-10-25 NOTE — Discharge Summary (Signed)
Physician Discharge Summary  Trevor Sullivan YTK:[id number] DOB: 27-Aug-1965 DOA: 10/23/2014  PCP: Trevor Seashore, MD  Admit date: 10/23/2014 Discharge date: 10/25/2014  Time spent: 45 minutes  Recommendations for Outpatient Follow-up:  1. Repeat CBC and BMP in one week 2. Monitor ambulatory oxygen saturation to assess home O2 need 3. Follow respiratory virus panel   Discharge Diagnoses:  Principal Problem:   Acute respiratory failure Active Problems:   Abdominal pain   Essential hypertension   H/O spinal fusion   Chronic pain   GERD (gastroesophageal reflux disease)   Nausea and vomiting   Discharge Condition: Stable  History of present illness:  Trevor Sullivan is a 49 year old male with a past medical history of COPD, HTN, and chronic pain who presented to the [facility] ED on 10/23/14 with nausea, vomiting, and coughing.   In the ED, patient was found to be afebrile and have leukocytosis with a WBC of 23. Patient required supplemental oxygen while in the ED. CT abdomen showed right lung pneumonia.  Since admission, patient was started on Rocephin and Zithromax. He was successfully weaned from supplemental oxygen and upon discharge had an ambulatory O2 sat of 97%. Patient responded to solumedrol and duo-nebs and feels he is back to baseline. Respiratory virus panel is still pending. At the time of discharge, WBC had normalized to 10.4.   Trevor Sullivan's abdomen complaints have also resolved. He has not experienced nausea or vomiting since admission and has had several large bowel movements during this time. He was given miralax and senna. C. Difficile was negative. Currently denies abdominal pain.   Hospital Course:  Acute respiratory failure: -Likely secondary to CAP causing COPD exacerbation  -Presented with WBC of 23 and required supplemental O2 -Responded to Duo-Neb, Solumedrol, and ABX (Zithromax and Rocephin) -Respiratory virus panel pending at time of  discharge -Discharged with 5 days of Levaquin and prednisone taper  Hypertension: -Stable while hospitalized -122/70 at time of discharge -Continue home medications of clonidine, amlodipine, benazepril, and bisoprolol  Abdominal pain -Patient presented with nausea/ vomiting and abdominal pain -CT showed mild distention of gallbladder without stones, no colitis  -C. Diff PCR was negative -Patient responded to Miralax 17g BID and Senna 2 tab BID and zofran PRN -Patient reported no nausea and vomiting since admission -Reported several large bowel movements during hospitalization and was pain free at time of discharge  Chronic pain: -S/P spinal fusion of L5-S1 -Patient followed by pain clinic and has home regimen including 145microgram patch, oxycodone 30mg  TID and gabapentin.  -Continue home medications during hospitalization and should be resumed upon discharge.   Procedures:  None  Consultations:  None  Discharge Exam: Filed Vitals:   10/24/14 1948 10/24/14 2133 10/25/14 0520 10/25/14 0940  BP:  132/75 141/91 122/70  Pulse:  69 55 70  Temp:  98.3 F (36.8 C) 97.8 F (36.6 C)   TempSrc:  Oral Oral   Resp:  20 20   Weight:      SpO2: 93% 93% 96%    Filed Weights   10/24/14 0637  Weight: 96.163 kg (212 lb)     General: Well developed, overweight, NAD, appears stated age Cardiovascular: RRR, S1 and S2, no murmur, gallop, or rub Respiratory: Diffuse wheezes throughout lung fields Abdominal: Soft, nontender, nondistended, +bowel sounds Extremities: warm and dry, 5/5 strength bilaterally, no evidence of cyanosis, clubbing, or edema  Discharge Instructions  Diet recommendation: Heart healthy- low sodium  Discharge Instructions    Call MD for:  difficulty  breathing, headache or visual disturbances    Complete by:  As directed      Call MD for:  extreme fatigue    Complete by:  As directed      Call MD for:  hives    Complete by:  As directed      Call MD for:   persistant dizziness or light-headedness    Complete by:  As directed      Call MD for:  persistant nausea and vomiting    Complete by:  As directed      Call MD for:  redness, tenderness, or signs of infection (pain, swelling, redness, odor or green/yellow discharge around incision site)    Complete by:  As directed      Call MD for:  severe uncontrolled pain    Complete by:  As directed      Call MD for:  temperature >100.4    Complete by:  As directed      Diet - low sodium heart healthy    Complete by:  As directed      Increase activity slowly    Complete by:  As directed           Discharge Medication List as of 10/25/2014 10:40 AM    START taking these medications   Details  levofloxacin (LEVAQUIN) 750 MG tablet Take 1 tablet (750 mg total) by mouth daily., Starting 10/25/2014, Until Discontinued, Print    predniSONE (STERAPRED UNI-PAK) 10 MG tablet Take 6-5-4-3-2-1 tablets by mouth daily till gone., Print      CONTINUE these medications which have NOT CHANGED   Details  amLODipine-benazepril (LOTREL) 10-40 MG per capsule Take 1 capsule by mouth daily., Until Discontinued, Historical Med    aspirin EC 81 MG tablet Take 81 mg by mouth daily., Until Discontinued, Historical Med    bisoprolol (ZEBETA) 10 MG tablet Take 20 mg by mouth daily., Until Discontinued, Historical Med    budesonide-formoterol (SYMBICORT) 160-4.5 MCG/ACT inhaler Inhale 2 puffs into the lungs daily., Until Discontinued, Historical Med    cloNIDine (CATAPRES) 0.3 MG tablet Take 0.3 mg by mouth 2 (two) times daily., Until Discontinued, Historical Med    diazepam (VALIUM) 5 MG tablet Take 5 mg by mouth 3 (three) times daily., Until Discontinued, Historical Med    fentaNYL (DURAGESIC - DOSED MCG/HR) 100 MCG/HR Place 100 mcg onto the skin every other day., Until Discontinued, Historical Med    gabapentin (NEURONTIN) 600 MG tablet Take 1,200 mg by mouth 4 (four) times daily., Until Discontinued, Historical  Med    ipratropium-albuterol (DUONEB) 0.5-2.5 (3) MG/3ML SOLN Take 3 mLs by nebulization every 4 (four) hours as needed., Starting 08/27/2014, Until Discontinued, Print    ondansetron (ZOFRAN ODT) 4 MG disintegrating tablet 4mg  ODT q4 hours prn nausea/vomit, Print    oxycodone (ROXICODONE) 30 MG immediate release tablet Take 30 mg by mouth 3 (three) times daily., Until Discontinued, Historical Med    pantoprazole (PROTONIX) 40 MG tablet Take 40 mg by mouth daily., Until Discontinued, Historical Med    tiotropium (SPIRIVA) 18 MCG inhalation capsule Place 18 mcg into inhaler and inhale daily., Until Discontinued, Historical Med      STOP taking these medications     ibuprofen (ADVIL,MOTRIN) 200 MG tablet      azithromycin (ZITHROMAX) 250 MG tablet        Allergies  Allergen Reactions  . Codeine Itching  . Morphine And Related Nausea Only  . Other Other (See Comments)  SSRI's and snri's-- starts shaking.    Follow-up Information    Follow up with [facility], MD In 1 week.   Specialty:  Internal Medicine   Contact information:   [address] [place]  [address] [phone]        The results of significant diagnostics from this hospitalization (including imaging, microbiology, ancillary and laboratory) are listed below for reference.    Significant Diagnostic Studies: Dg Chest 2 View  10/23/2014   CLINICAL DATA:  Abdominal pain.  Nausea and vomiting.  EXAM: CHEST  2 VIEW  COMPARISON:  08/27/2014  FINDINGS: The hazy infiltrate in the right lung has progressed at the right lung base and is unchanged in the right upper lung zone.  Left lung is clear. Heart size and pulmonary vascularity are normal. No acute osseous abnormalities. No effusions.  IMPRESSION: Persistent and slightly progressive right lung infiltrates.   Electronically Signed   By: Lorriane Shire M.D.   On: 10/23/2014 15:40   Ct Abdomen Pelvis W Contrast  10/24/2014   ADDENDUM REPORT:  10/24/2014 14:08  ADDENDUM: This is the correct second impression:  No acute inflammatory process within abdomen.   Electronically Signed   By: Lahoma Crocker M.D.   On: 10/24/2014 14:08   10/24/2014   CLINICAL DATA:  Abdominal pain starting Saturday, recent dental abscess, diarrhea  EXAM: CT ABDOMEN AND PELVIS WITH CONTRAST  TECHNIQUE: Multidetector CT imaging of the abdomen and pelvis was performed using the standard protocol following bolus administration of intravenous contrast.  CONTRAST:  15mL OMNIPAQUE IOHEXOL 300 MG/ML SOLN, 49mL OMNIPAQUE IOHEXOL 300 MG/ML SOLN  COMPARISON:  None.  FINDINGS: Lung bases shows patchy ground-glass infiltrate with tree and bud appearance in visualized right lung highly suspicious for infectious process or atypical pneumonia. The left lung is clear. Enhanced liver is unremarkable. Mild distended gallbladder without calcified gallstones. The pancreas, spleen and adrenal glands are unremarkable. Atherosclerotic calcifications of abdominal aorta and iliac arteries. Enhanced kidneys are symmetrical in size. No hydronephrosis or hydroureter. Delayed renal images shows bilateral renal symmetrical excretion. Bilateral visualized proximal ureter is unremarkable.  There is no small bowel obstruction. No ascites or free air. No adenopathy.  Sagittal images of the spine shows posterior metallic fusion at A2-N0 level.  No pericecal inflammation. Normal retrocecal appendix. The terminal ileum is unremarkable. Some stool and gas noted in rectosigmoid colon. No distal colonic obstruction. No colitis or diverticulitis.  IMPRESSION: 1. There is patchy infiltrate with tree and bud appearance in right lung. This is highly suspicious for atypical pneumonia or infectious process. Clinical correlation is necessary. The left lung is clear. 2. Acute inflammatory process within abdomen. 3. No hydronephrosis or hydroureter. 4. Bilateral renal symmetrical excretion. 5. Normal appendix.  No pericecal  inflammation. 6. No colitis or diverticulitis.  Electronically Signed: By: Lahoma Crocker M.D. On: 10/23/2014 18:07   US Abdomen Limited Ruq  10/23/2014   CLINICAL DATA:  Right upper quadrant pain with coughing.  EXAM: US ABDOMEN LIMITED - RIGHT UPPER QUADRANT  COMPARISON:  CT 10/23/2014  FINDINGS: Gallbladder:  Gallbladder is distended a 4.6 cm. No gallbladder wall thickening. No gallstones present. Negative sonographic Murphy's is  Common bile duct:  Diameter: Mildly dilated at 8 mm.  Liver:  No biliary duct dilatation.  No focal hepatic lesion.  IMPRESSION: 1. Gallbladder distended without evidence of cholecystitis. No gallstones. 2. Dilatation common bile duct. Recommend correlation with bilirubin and if elevated consider MRCP. If bilirubin is normal, this is likely a  normal variation.   Electronically Signed   By: Suzy Bouchard M.D.   On: 10/23/2014 21:20    Microbiology: Recent Results (from the past 240 hour(s))  Culture, blood (routine x 2) Call MD if unable to obtain prior to antibiotics being given     Status: None (Preliminary result)   Collection Time: 10/23/14  9:41 PM  Result Value Ref Range Status   Specimen Description BLOOD RIGHT HAND  Final   Special Requests BOTTLES DRAWN AEROBIC ONLY 8CC  Final   Culture   Final           BLOOD CULTURE RECEIVED NO GROWTH TO DATE CULTURE WILL BE HELD FOR 5 DAYS BEFORE ISSUING A FINAL NEGATIVE REPORT Performed at [facility]    Report Status PENDING  Incomplete  Culture, blood (routine x 2) Call MD if unable to obtain prior to antibiotics being given     Status: None (Preliminary result)   Collection Time: 10/23/14  9:46 PM  Result Value Ref Range Status   Specimen Description BLOOD RIGHT ARM  Final   Special Requests BOTTLES DRAWN AEROBIC AND ANAEROBIC 8CC  Final   Culture   Final           BLOOD CULTURE RECEIVED NO GROWTH TO DATE CULTURE WILL BE HELD FOR 5 DAYS BEFORE ISSUING A FINAL NEGATIVE REPORT Performed at [facility]    Report Status PENDING  Incomplete  Clostridium Difficile by PCR     Status: None   Collection Time: 10/24/14 12:04 AM  Result Value Ref Range Status   C difficile by pcr NEGATIVE NEGATIVE Final    Comment: Performed at [facility]  Stool culture     Status: None (Preliminary result)   Collection Time: 10/24/14 12:05 AM  Result Value Ref Range Status   Specimen Description STOOL  Final   Special Requests Normal  Final   Culture   Final    NO SUSPICIOUS COLONIES, CONTINUING TO HOLD Performed at [facility]    Report Status PENDING  Incomplete     Labs: Basic Metabolic Panel:  Recent Labs Lab 10/23/14 1519 10/24/14 0726 10/25/14 0432  NA 138 134* 135  K 3.2* 3.5 4.4  CL 93* 95* 98  CO2 31 31 33*  GLUCOSE 156* 154* 164*  BUN 24* 16 25*  CREATININE 1.08 0.83 0.90  CALCIUM 9.4 8.3* 8.4   Liver Function Tests:  Recent Labs Lab 10/23/14 1519 10/24/14 0726  AST 22 15  ALT 15 14  ALKPHOS 54 44  BILITOT 1.2 0.8  PROT 8.3 7.1  ALBUMIN 4.5 3.7   CBC:  Recent Labs Lab 10/23/14 1519 10/24/14 0726 10/25/14 0432  WBC 23.2* 8.5 10.4  NEUTROABS 21.0*  --   --   HGB 13.4 11.8* 10.3*  HCT 39.8 35.3* 31.5*  MCV 97.8 100.0 101.6*  PLT 389 262 284   Signed:  Raspect, Erin, PA-S [facility] 10/25/2014, 12:10 PM   Addendum  I personally evaluated patient on 10/25/2014 and agree with above findings. Patient is a pleasant 49 year old gentleman with a history of COPD admitted to the medicine service on 10/23/2014 when he presented with complaints of abdominal pain, shortness of breath cough and wheezing. He was found to be in acute respiratory failure secondary to COPD exacerbation. Patient was started on systemic steroids, DuoNeb's, empiric IV antibiotic therapy. With regard to his abdominal pain was further worked up with a CT scan of abdomen and pelvis which did  not show acute intra-abdominal pathology. Abdominal ultrasound was negative  for cholecystitis, dilatation of common bile duct. Patient showing clinical improvement through this hospitalization. He was discharged in stable condition on 10/23/2014 with Levaquin and steroid taper. Patient reporting feeling better on day of discharge, with improvement to lung exam.

## 2014-10-25 NOTE — Clinical Documentation Improvement (Signed)
Potassium as below.  Potassium chloride SA 40 mEq ordered once 10/24/2014.  Please identify any clinical conditions associated with the abnormal potassium level and document in your progress note and discharge summary.    Component      Potassium  Latest Ref Rng      3.5 - 5.1 mmol/L  10/23/2014     3:19 PM 3.2 (L)  10/24/2014      3.5  10/25/2014      4.4   Possible Clinical Conditions: -Hypokalemia -Other condition (please specify) -Unable to determine at present -Not clinically significant  Thank you, Mateo Flow, RN [phone] Clinical Documentation Specialist

## 2014-10-25 NOTE — Progress Notes (Signed)
Patient d/c instructions given, verbalized understanding, familiar with his home meds ,teach back utilized ,stable on d/c. No c/o SOB. D/c at around 1055.

## 2014-10-25 NOTE — Care Management Note (Signed)
CARE MANAGEMENT NOTE 10/25/2014  Patient:  Trevor Sullivan, Trevor Sullivan   Account Number:  [id number]  Date Initiated:  10/25/2014  Documentation initiated by:  Marney Doctor  Subjective/Objective Assessment:   49 yo admitted with acute respiratory failure. Hx of COPD     Action/Plan:   From home with spouse   Anticipated DC Date:  10/25/2014   Anticipated DC Plan:  [place]  CM consult      Choice offered to / List presented to:             Status of service:  Completed, signed off Medicare Important Message given?   (If response is "NO", the following Medicare IM given date fields will be blank) Date Medicare IM given:   Medicare IM given by:   Date Additional Medicare IM given:   Additional Medicare IM given by:    Discharge Disposition:    Per UR Regulation:  Reviewed for med. necessity/level of care/duration of stay  If discussed at [place] of Stay Meetings, dates discussed:    Comments:  10/25/14 Marney Doctor RN,BSN,NCM [phone] CM consult for COPD GOLD protocol.  Pt has had no previous admissions related to his COPD documented in EPIC so he would not be a true COPD GOLD.  Pt to DC home today.  Per MD he is ambulating in hall without 02 and has 02 saturations of 96-97% on RA while ambulating.  PT states pt has no home PT needs.  CM will sign off.

## 2014-10-27 LAB — STOOL CULTURE: Special Requests: NORMAL

## 2014-10-30 DIAGNOSIS — R079 Chest pain, unspecified: Secondary | ICD-10-CM | POA: Diagnosis not present

## 2014-10-30 DIAGNOSIS — R05 Cough: Secondary | ICD-10-CM | POA: Diagnosis not present

## 2014-10-30 DIAGNOSIS — J22 Unspecified acute lower respiratory infection: Secondary | ICD-10-CM | POA: Diagnosis not present

## 2014-10-30 DIAGNOSIS — R509 Fever, unspecified: Secondary | ICD-10-CM | POA: Diagnosis not present

## 2014-10-30 DIAGNOSIS — R0602 Shortness of breath: Secondary | ICD-10-CM | POA: Diagnosis not present

## 2014-10-30 LAB — CULTURE, BLOOD (ROUTINE X 2)
CULTURE: NO GROWTH
Culture: NO GROWTH

## 2014-11-01 LAB — RESPIRATORY VIRUS PANEL
Adenovirus: NEGATIVE
INFLUENZA B 1: NEGATIVE
Influenza A: NEGATIVE
Metapneumovirus: NEGATIVE
PARAINFLUENZA 1 A: NEGATIVE
Parainfluenza 2: NEGATIVE
Parainfluenza 3: NEGATIVE
RESPIRATORY SYNCYTIAL VIRUS A: NEGATIVE
RESPIRATORY SYNCYTIAL VIRUS B: NEGATIVE
Rhinovirus: NEGATIVE

## 2014-11-02 DIAGNOSIS — J189 Pneumonia, unspecified organism: Secondary | ICD-10-CM | POA: Diagnosis not present

## 2014-11-02 DIAGNOSIS — I10 Essential (primary) hypertension: Secondary | ICD-10-CM | POA: Diagnosis not present

## 2014-11-02 DIAGNOSIS — Z79899 Other long term (current) drug therapy: Secondary | ICD-10-CM | POA: Diagnosis not present

## 2014-11-02 DIAGNOSIS — R5383 Other fatigue: Secondary | ICD-10-CM | POA: Diagnosis not present

## 2014-11-14 DIAGNOSIS — G894 Chronic pain syndrome: Secondary | ICD-10-CM | POA: Diagnosis not present

## 2014-11-14 DIAGNOSIS — J449 Chronic obstructive pulmonary disease, unspecified: Secondary | ICD-10-CM | POA: Diagnosis not present

## 2015-01-08 DIAGNOSIS — S79912A Unspecified injury of left hip, initial encounter: Secondary | ICD-10-CM | POA: Diagnosis not present

## 2015-01-08 DIAGNOSIS — M25552 Pain in left hip: Secondary | ICD-10-CM | POA: Diagnosis not present

## 2015-03-19 DIAGNOSIS — H5203 Hypermetropia, bilateral: Secondary | ICD-10-CM | POA: Diagnosis not present

## 2015-04-02 DIAGNOSIS — M25552 Pain in left hip: Secondary | ICD-10-CM | POA: Diagnosis not present

## 2015-04-02 DIAGNOSIS — M5440 Lumbago with sciatica, unspecified side: Secondary | ICD-10-CM | POA: Diagnosis not present

## 2015-04-02 DIAGNOSIS — G894 Chronic pain syndrome: Secondary | ICD-10-CM | POA: Diagnosis not present

## 2015-04-09 DIAGNOSIS — M25552 Pain in left hip: Secondary | ICD-10-CM | POA: Diagnosis not present

## 2015-05-01 DIAGNOSIS — Z79891 Long term (current) use of opiate analgesic: Secondary | ICD-10-CM | POA: Diagnosis not present

## 2015-05-14 DIAGNOSIS — J189 Pneumonia, unspecified organism: Secondary | ICD-10-CM | POA: Diagnosis not present

## 2015-06-18 IMAGING — CR DG CHEST 2V
2 series · 2 of 2 positions shown · non-contrast
Comparison: None.

CLINICAL DATA: Initial encounter for Cough today

EXAM:
CHEST  2 VIEW

[w chest pa]
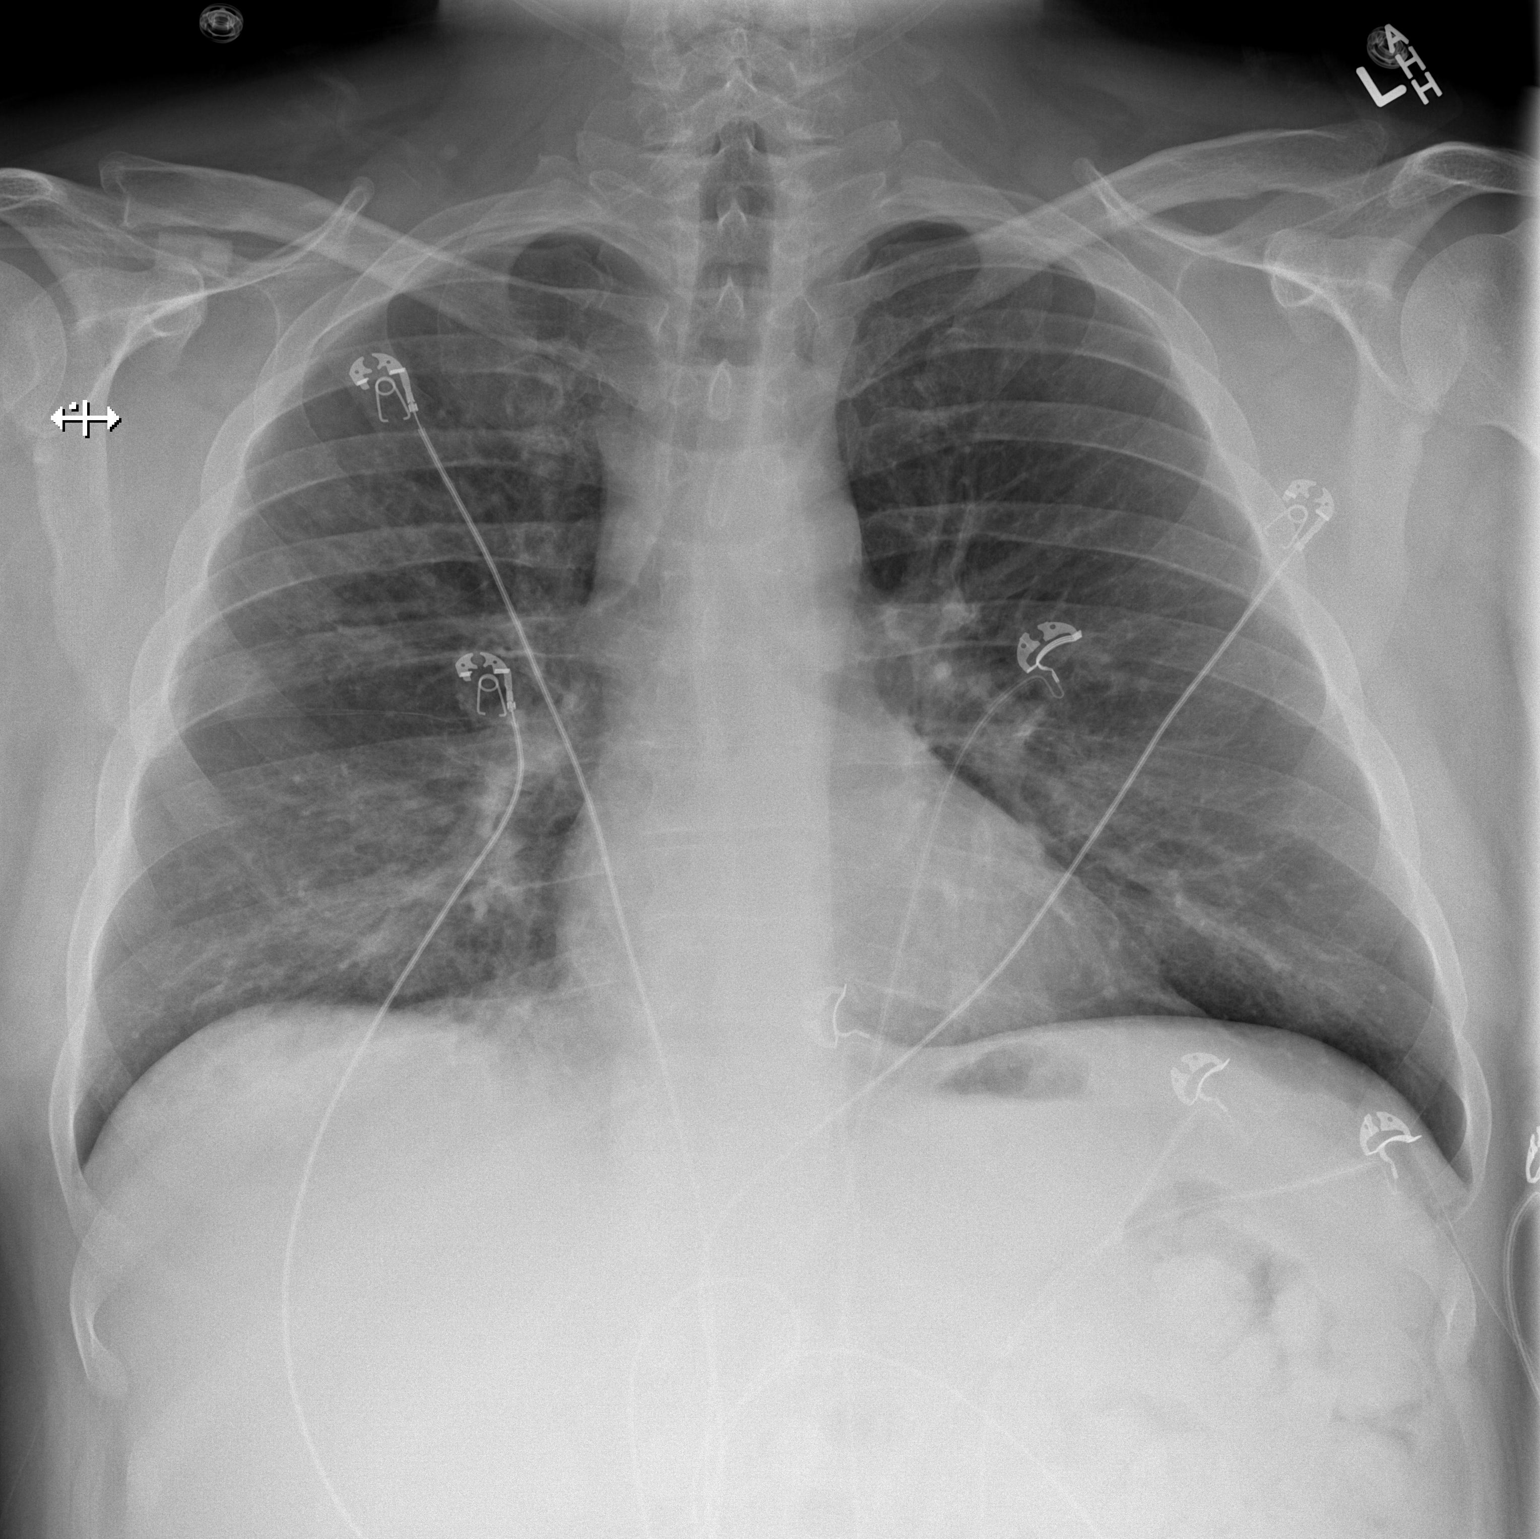

[w chest lat]
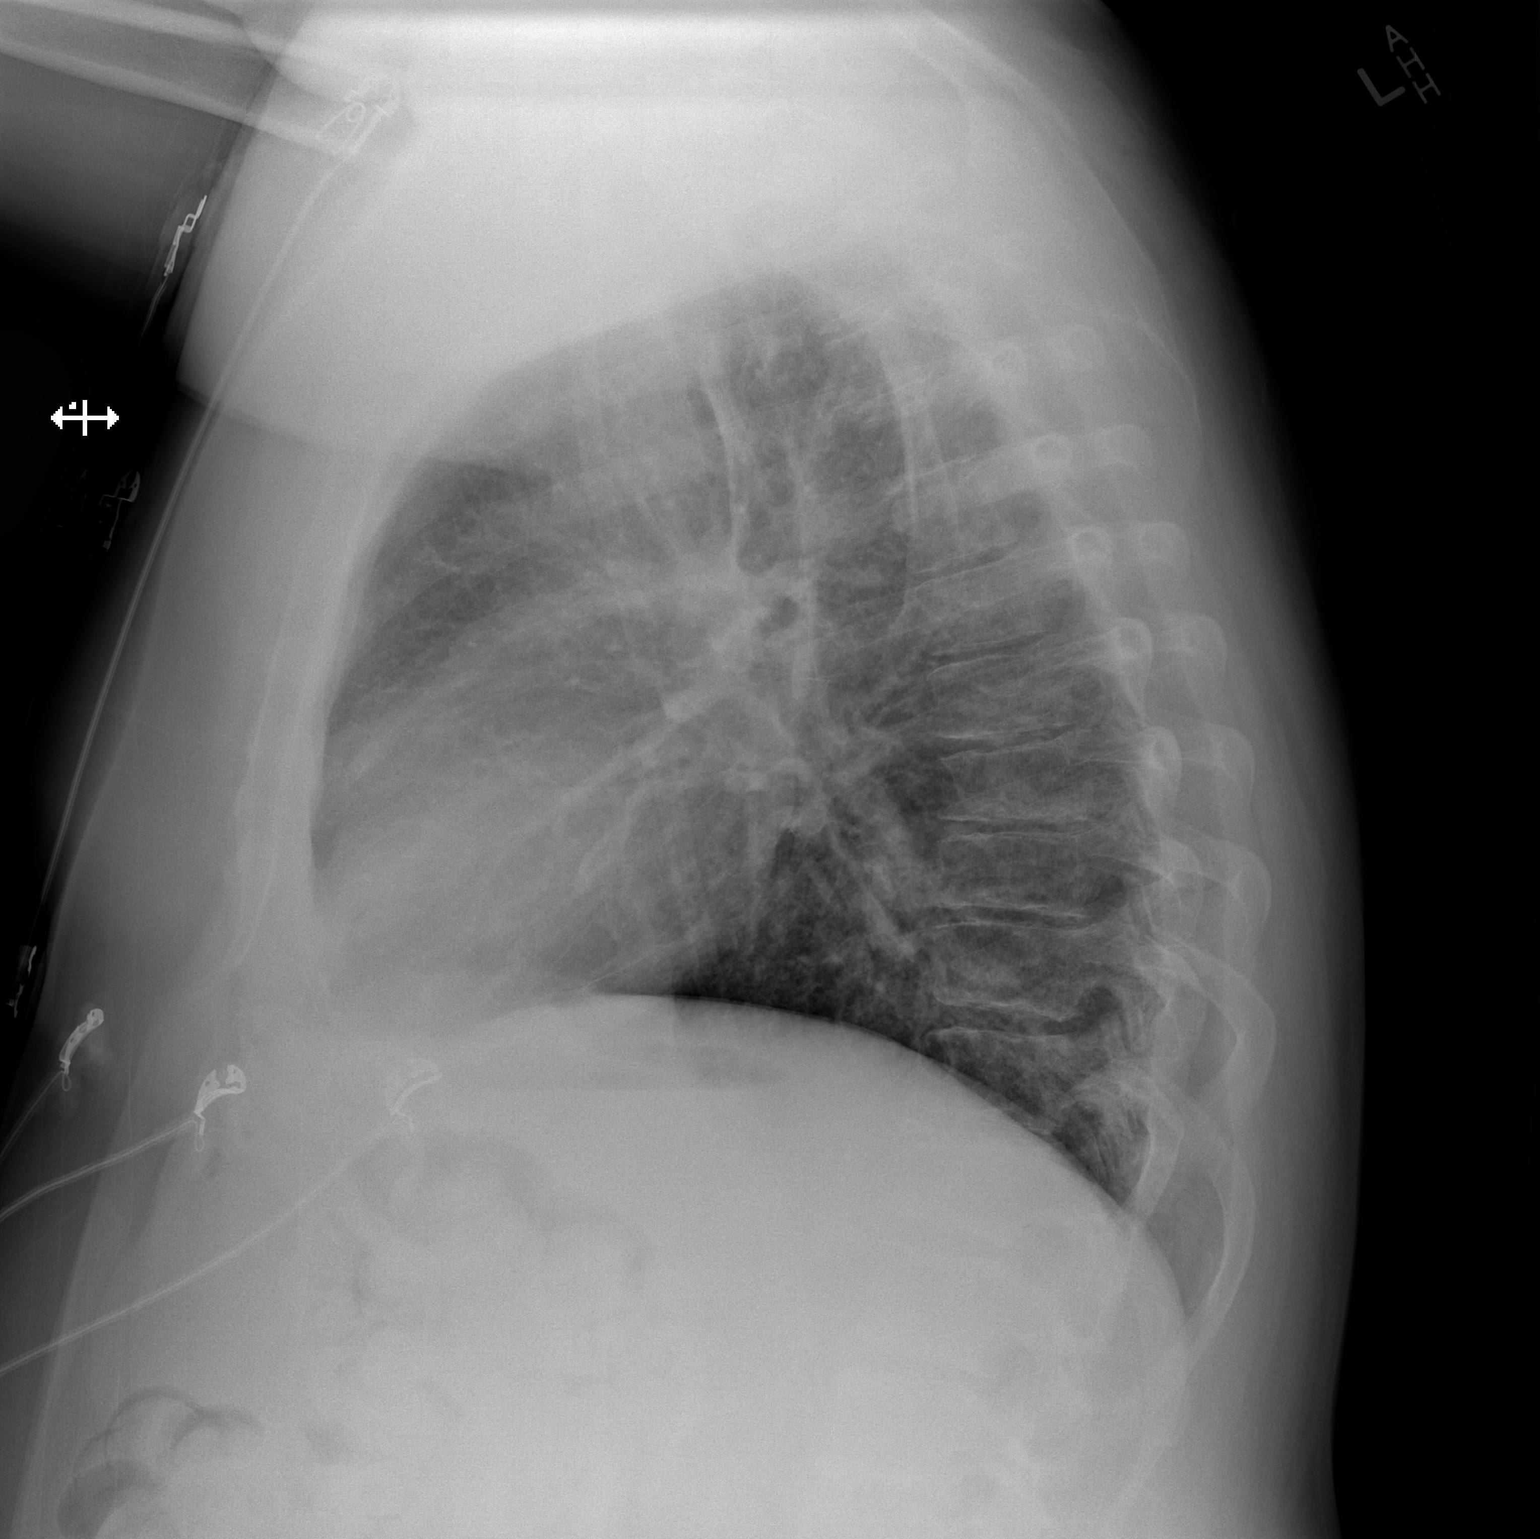

[2 of 2 positions shown; findings below may reference images not displayed]

FINDINGS: Two view exam shows patchy airspace disease in the right upper and
lower lung. Left lung is clear. The cardiopericardial silhouette is
within normal limits for size. Imaged bony structures of the thorax
are intact. Telemetry leads overlie the chest.
IMPRESSION: Patchy right lung airspace disease suggest multifocal pneumonia.
Follow-up imaging is recommended to ensure resolution.

## 2015-07-03 DIAGNOSIS — M25551 Pain in right hip: Secondary | ICD-10-CM | POA: Diagnosis not present

## 2015-07-03 DIAGNOSIS — M539 Dorsopathy, unspecified: Secondary | ICD-10-CM | POA: Diagnosis not present

## 2015-07-14 DIAGNOSIS — M50221 Other cervical disc displacement at C4-C5 level: Secondary | ICD-10-CM | POA: Diagnosis not present

## 2015-07-14 DIAGNOSIS — M25511 Pain in right shoulder: Secondary | ICD-10-CM | POA: Diagnosis not present

## 2015-07-14 DIAGNOSIS — M50222 Other cervical disc displacement at C5-C6 level: Secondary | ICD-10-CM | POA: Diagnosis not present

## 2015-07-14 DIAGNOSIS — M5124 Other intervertebral disc displacement, thoracic region: Secondary | ICD-10-CM | POA: Diagnosis not present

## 2015-07-14 DIAGNOSIS — M25552 Pain in left hip: Secondary | ICD-10-CM | POA: Diagnosis not present

## 2015-07-14 DIAGNOSIS — M5125 Other intervertebral disc displacement, thoracolumbar region: Secondary | ICD-10-CM | POA: Diagnosis not present

## 2015-07-16 DIAGNOSIS — M2550 Pain in unspecified joint: Secondary | ICD-10-CM | POA: Diagnosis not present

## 2015-07-16 DIAGNOSIS — M5412 Radiculopathy, cervical region: Secondary | ICD-10-CM | POA: Diagnosis not present

## 2015-07-23 DIAGNOSIS — I10 Essential (primary) hypertension: Secondary | ICD-10-CM | POA: Diagnosis not present

## 2015-07-23 DIAGNOSIS — J449 Chronic obstructive pulmonary disease, unspecified: Secondary | ICD-10-CM | POA: Diagnosis not present

## 2015-07-23 DIAGNOSIS — G894 Chronic pain syndrome: Secondary | ICD-10-CM | POA: Diagnosis not present

## 2015-07-23 DIAGNOSIS — Z23 Encounter for immunization: Secondary | ICD-10-CM | POA: Diagnosis not present

## 2015-07-25 ENCOUNTER — Emergency Department (HOSPITAL_COMMUNITY): Payer: PRIVATE HEALTH INSURANCE

## 2015-07-25 ENCOUNTER — Observation Stay (HOSPITAL_COMMUNITY)
Admission: EM | Admit: 2015-07-25 | Discharge: 2015-07-25 | Discharge status: Against Medical Advice | Payer: PRIVATE HEALTH INSURANCE | Attending: Internal Medicine | Admitting: Internal Medicine

## 2015-07-25 ENCOUNTER — Encounter (HOSPITAL_COMMUNITY): Payer: Self-pay

## 2015-07-25 DIAGNOSIS — R0600 Dyspnea, unspecified: Secondary | ICD-10-CM | POA: Diagnosis present

## 2015-07-25 DIAGNOSIS — M545 Low back pain: Secondary | ICD-10-CM | POA: Insufficient documentation

## 2015-07-25 DIAGNOSIS — I10 Essential (primary) hypertension: Secondary | ICD-10-CM | POA: Insufficient documentation

## 2015-07-25 DIAGNOSIS — Z888 Allergy status to other drugs, medicaments and biological substances status: Secondary | ICD-10-CM | POA: Insufficient documentation

## 2015-07-25 DIAGNOSIS — F17210 Nicotine dependence, cigarettes, uncomplicated: Secondary | ICD-10-CM | POA: Insufficient documentation

## 2015-07-25 DIAGNOSIS — G8929 Other chronic pain: Secondary | ICD-10-CM | POA: Insufficient documentation

## 2015-07-25 DIAGNOSIS — R112 Nausea with vomiting, unspecified: Secondary | ICD-10-CM

## 2015-07-25 DIAGNOSIS — Z885 Allergy status to narcotic agent status: Secondary | ICD-10-CM | POA: Diagnosis not present

## 2015-07-25 DIAGNOSIS — J9601 Acute respiratory failure with hypoxia: Secondary | ICD-10-CM | POA: Diagnosis not present

## 2015-07-25 DIAGNOSIS — R0902 Hypoxemia: Secondary | ICD-10-CM | POA: Diagnosis not present

## 2015-07-25 DIAGNOSIS — R911 Solitary pulmonary nodule: Secondary | ICD-10-CM | POA: Diagnosis not present

## 2015-07-25 DIAGNOSIS — J441 Chronic obstructive pulmonary disease with (acute) exacerbation: Secondary | ICD-10-CM | POA: Insufficient documentation

## 2015-07-25 HISTORY — DX: Pneumonia, unspecified organism: J18.9

## 2015-07-25 LAB — COMPREHENSIVE METABOLIC PANEL
ALK PHOS: 64 U/L (ref 38–126)
ALT: 15 U/L — ABNORMAL LOW (ref 17–63)
ANION GAP: 11 (ref 5–15)
AST: 18 U/L (ref 15–41)
Albumin: 4.1 g/dL (ref 3.5–5.0)
BILIRUBIN TOTAL: 0.5 mg/dL (ref 0.3–1.2)
BUN: 23 mg/dL — ABNORMAL HIGH (ref 6–20)
CALCIUM: 9 mg/dL (ref 8.9–10.3)
CO2: 32 mmol/L (ref 22–32)
Chloride: 96 mmol/L — ABNORMAL LOW (ref 101–111)
Creatinine, Ser: 1.12 mg/dL (ref 0.61–1.24)
GFR calc Af Amer: >60 mL/min (ref >60)
GFR calc non Af Amer: >60 mL/min (ref >60)
Glucose, Bld: 128 mg/dL — ABNORMAL HIGH (ref 65–99)
Potassium: 3.5 mmol/L (ref 3.5–5.1)
Sodium: 139 mmol/L (ref 135–145)
TOTAL PROTEIN: 7.4 g/dL (ref 6.5–8.1)

## 2015-07-25 LAB — URINALYSIS, ROUTINE W REFLEX MICROSCOPIC
Bilirubin Urine: NEGATIVE
Glucose, UA: NEGATIVE mg/dL
Hgb urine dipstick: NEGATIVE
KETONES UR: NEGATIVE mg/dL
Leukocytes, UA: NEGATIVE
NITRITE: NEGATIVE
PH: 6 (ref 5.0–8.0)
Protein, ur: 30 mg/dL — AB
Specific Gravity, Urine: 1.02 (ref 1.005–1.030)

## 2015-07-25 LAB — RAPID URINE DRUG SCREEN, HOSP PERFORMED
Amphetamines: NOT DETECTED
BARBITURATES: NOT DETECTED
Benzodiazepines: POSITIVE — AB
Cocaine: NOT DETECTED
OPIATES: POSITIVE — AB
TETRAHYDROCANNABINOL: NOT DETECTED

## 2015-07-25 LAB — CBC
HCT: 38.2 % — ABNORMAL LOW (ref 39.0–52.0)
HEMOGLOBIN: 13 g/dL (ref 13.0–17.0)
MCH: 32.3 pg (ref 26.0–34.0)
MCHC: 34 g/dL (ref 30.0–36.0)
MCV: 95 fL (ref 78.0–100.0)
Platelets: 296 10*3/uL (ref 150–400)
RBC: 4.02 MIL/uL — AB (ref 4.22–5.81)
RDW: 13.2 % (ref 11.5–15.5)
WBC: 25.3 10*3/uL — AB (ref 4.0–10.5)

## 2015-07-25 LAB — URINE MICROSCOPIC-ADD ON

## 2015-07-25 LAB — MRSA PCR SCREENING: MRSA by PCR: NEGATIVE

## 2015-07-25 LAB — I-STAT CG4 LACTIC ACID, ED
Lactic Acid, Venous: 1.19 mmol/L (ref 0.5–2.0)
Lactic Acid, Venous: 0.88 mmol/L (ref 0.5–2.0)

## 2015-07-25 LAB — LIPASE, BLOOD: Lipase: 28 U/L (ref 11–51)

## 2015-07-25 MED ORDER — CLONIDINE HCL 0.3 MG PO TABS
0.3000 mg | ORAL_TABLET | Freq: Three times a day (TID) | ORAL | Status: DC
  Administered 2015-07-25: 0.3 mg via ORAL
  Filled 2015-07-25: qty 1

## 2015-07-25 MED ORDER — AZITHROMYCIN 500 MG IV SOLR: 500.0000 mg | INTRAVENOUS | Status: DC

## 2015-07-25 MED ORDER — DEXTROSE 5 % IV SOLN
1.0000 g | Freq: Once | INTRAVENOUS | Status: AC
  Administered 2015-07-25: 1 g via INTRAVENOUS
  Filled 2015-07-25: qty 10

## 2015-07-25 MED ORDER — ONDANSETRON 8 MG PO TBDP
8.0000 mg | ORAL_TABLET | Freq: Three times a day (TID) | ORAL | Status: DC | PRN
  Filled 2015-07-25: qty 1

## 2015-07-25 MED ORDER — GUAIFENESIN ER 600 MG PO TB12
600.0000 mg | ORAL_TABLET | Freq: Two times a day (BID) | ORAL | Status: DC
  Administered 2015-07-25: 600 mg via ORAL
  Filled 2015-07-25: qty 1

## 2015-07-25 MED ORDER — DEXTROSE 5 % IV SOLN: 1.0000 g | INTRAVENOUS | Status: DC

## 2015-07-25 MED ORDER — METOCLOPRAMIDE HCL 5 MG/ML IJ SOLN
10.0000 mg | Freq: Once | INTRAMUSCULAR | Status: AC
Start: 2015-07-25 — End: 2015-07-25
  Administered 2015-07-25: 10 mg via INTRAVENOUS
  Filled 2015-07-25: qty 2

## 2015-07-25 MED ORDER — DEXTROSE 5 % IV SOLN
500.0000 mg | Freq: Once | INTRAVENOUS | Status: AC
Start: 2015-07-25 — End: 2015-07-25
  Administered 2015-07-25: 500 mg via INTRAVENOUS
  Filled 2015-07-25: qty 500

## 2015-07-25 MED ORDER — SODIUM CHLORIDE 0.9 % IV BOLUS (SEPSIS)
1000.0000 mL | Freq: Once | INTRAVENOUS | Status: AC
  Administered 2015-07-25: 1000 mL via INTRAVENOUS

## 2015-07-25 MED ORDER — KETOROLAC TROMETHAMINE 60 MG/2ML IM SOLN
60.0000 mg | Freq: Once | INTRAMUSCULAR | Status: AC
Start: 2015-07-25 — End: 2015-07-25
  Administered 2015-07-25: 60 mg via INTRAMUSCULAR
  Filled 2015-07-25: qty 2

## 2015-07-25 MED ORDER — METHYLPREDNISOLONE SODIUM SUCC 125 MG IJ SOLR
80.0000 mg | Freq: Once | INTRAMUSCULAR | Status: AC
  Administered 2015-07-25: 80 mg via INTRAVENOUS
  Filled 2015-07-25: qty 2

## 2015-07-25 MED ORDER — GABAPENTIN 300 MG PO CAPS
600.0000 mg | ORAL_CAPSULE | Freq: Three times a day (TID) | ORAL | Status: DC
  Administered 2015-07-25: 600 mg via ORAL
  Filled 2015-07-25: qty 2

## 2015-07-25 MED ORDER — PANTOPRAZOLE SODIUM 40 MG PO TBEC
40.0000 mg | DELAYED_RELEASE_TABLET | Freq: Every day | ORAL | Status: DC
  Administered 2015-07-25: 40 mg via ORAL
  Filled 2015-07-25: qty 1

## 2015-07-25 MED ORDER — AMLODIPINE BESY-BENAZEPRIL HCL 10-40 MG PO CAPS: 1.0000 | ORAL_CAPSULE | Freq: Every day | ORAL | Status: DC

## 2015-07-25 MED ORDER — ONDANSETRON HCL 4 MG/2ML IJ SOLN
4.0000 mg | Freq: Once | INTRAMUSCULAR | Status: AC
Start: 2015-07-25 — End: 2015-07-25
  Administered 2015-07-25: 4 mg via INTRAVENOUS
  Filled 2015-07-25: qty 2

## 2015-07-25 MED ORDER — MORPHINE SULFATE (PF) 4 MG/ML IV SOLN
4.0000 mg | Freq: Once | INTRAVENOUS | Status: AC
Start: 2015-07-25 — End: 2015-07-25
  Administered 2015-07-25: 4 mg via INTRAVENOUS
  Filled 2015-07-25: qty 1

## 2015-07-25 MED ORDER — OXYCODONE HCL 5 MG PO TABS: 30.0000 mg | ORAL_TABLET | ORAL | Status: DC | PRN

## 2015-07-25 MED ORDER — BENAZEPRIL HCL 40 MG PO TABS
40.0000 mg | ORAL_TABLET | Freq: Every day | ORAL | Status: DC
  Filled 2015-07-25: qty 1

## 2015-07-25 MED ORDER — OXYCODONE HCL 5 MG PO TABS
30.0000 mg | ORAL_TABLET | Freq: Three times a day (TID) | ORAL | Status: DC
  Administered 2015-07-25: 30 mg via ORAL
  Filled 2015-07-25: qty 6

## 2015-07-25 MED ORDER — AMLODIPINE BESYLATE 10 MG PO TABS: 10.0000 mg | ORAL_TABLET | Freq: Every day | ORAL | Status: DC

## 2015-07-25 MED ORDER — ENOXAPARIN SODIUM 40 MG/0.4ML ~~LOC~~ SOLN
40.0000 mg | SUBCUTANEOUS | Status: DC
  Administered 2015-07-25: 40 mg via SUBCUTANEOUS
  Filled 2015-07-25: qty 0.4

## 2015-07-25 MED ORDER — HYDROMORPHONE HCL 1 MG/ML IJ SOLN
1.0000 mg | INTRAMUSCULAR | Status: DC | PRN
  Administered 2015-07-25: 1 mg via INTRAVENOUS
  Filled 2015-07-25: qty 1

## 2015-07-25 MED ORDER — IPRATROPIUM-ALBUTEROL 0.5-2.5 (3) MG/3ML IN SOLN: 3.0000 mL | RESPIRATORY_TRACT | Status: DC | PRN

## 2015-07-25 MED ORDER — GUAIFENESIN-DM 100-10 MG/5ML PO SYRP: 5.0000 mL | ORAL_SOLUTION | ORAL | Status: DC | PRN

## 2015-07-25 MED ORDER — FLEET ENEMA 7-19 GM/118ML RE ENEM
1.0000 | ENEMA | Freq: Once | RECTAL | Status: DC
Start: 2015-07-25 — End: 2015-07-25
  Filled 2015-07-25: qty 1

## 2015-07-25 MED ORDER — IPRATROPIUM-ALBUTEROL 0.5-2.5 (3) MG/3ML IN SOLN
3.0000 mL | Freq: Four times a day (QID) | RESPIRATORY_TRACT | Status: DC
  Administered 2015-07-25: 3 mL via RESPIRATORY_TRACT
  Filled 2015-07-25: qty 3

## 2015-07-25 MED ORDER — METHYLPREDNISOLONE SODIUM SUCC 125 MG IJ SOLR
80.0000 mg | Freq: Four times a day (QID) | INTRAMUSCULAR | Status: DC
  Administered 2015-07-25: 80 mg via INTRAVENOUS
  Filled 2015-07-25: qty 2

## 2015-07-25 MED ORDER — FENTANYL CITRATE (PF) 100 MCG/2ML IJ SOLN: 100.0000 ug | INTRAMUSCULAR | Status: DC | PRN

## 2015-07-25 NOTE — ED Notes (Addendum)
O2 SAT 83%.  Applied Nasal canula at 4L O2 Sat improved to 91%.  Breathing even and unlabored NAD at this time.

## 2015-07-25 NOTE — ED Notes (Signed)
Patient transported to CT 

## 2015-07-25 NOTE — ED Notes (Signed)
AWARE OF NEED FOR URINE SAMPLE- URINAL GIVEN

## 2015-07-25 NOTE — ED Notes (Addendum)
Pt c/o of Nausea and vomiting x1 day.  HA since 0630 am today.  No bowel movement for 3 days. Patient O2 at home 83%, wife advised that wears O2 as needed at home when patient is ill.  Marland Kitchen

## 2015-07-25 NOTE — H&P (Addendum)
[facility] History and Physical  Indio Samp [id number] DOB: [birth year] DOA: 07/25/2015  Referring physician: ED physician PCP: Derrill Center, MD   Chief Complaint: dyspnea   HPI:  Pt is 49 yo male who presented with main concern on several days duration of dyspnea with exertion and at rest, associated with wheezing and cough, mostly non productive, typical of his O[employer] flares. Pt reports no chest pain other than when he is coughing but has noted some chills. Pt reports similar episodes in the past typical of his COPD. Please note that pt i spoor historian and says he is to tired now and just wants to go home.   Assessment and Plan: Active Problems: Acute hypoxic respiratory failure with hypoxia  - secondary to acute on chronic COPD exacerbation with ? LLL PNA - pt admitted to SDU and started on solumedrol, BD's scheduled and as needed - pt also started empiric ABX - sputum and blood cultures recommended - pt advised to stay in hospital but elects to leave AMA - discussed with family at bedside, all verbalized understanding    Radiological Exams on Admission: Dg Chest 2 View  07/25/2015  CLINICAL DATA:  Cough and shortness of breath ; decreased oxygen saturation EXAM: CHEST  2 VIEW COMPARISON:  May 14, 2015 and October 30, 2014 FINDINGS: There is chronic interstitial prominence in the right mid and lower lung zones, likely due to scarring/fibrotic change in these areas. There is no frank edema or consolidation. On the frontal view, there is a 1.8 x 1.1 cm ill-defined opacity in the left lower lung region. Heart size and pulmonary vascularity are normal. No adenopathy. No bone lesions. IMPRESSION: 1.8 x 1.1 cm ill-defined opacity left lower lung region. This finding warrants noncontrast enhanced chest CT to further assess. Areas of interstitial prominence, likely due to mild underlying fibrosis and scarring. No well-defined edema or consolidation. Electronically Signed    By: Lowella Grip III M.D.   On: 07/25/2015 08:32   Dg Abd 1 View  07/25/2015  CLINICAL DATA:  Mid abdominal discomfort worsening over last 3 days, no bowel movement, hypertension, COPD, smoker EXAM: ABDOMEN - 1 VIEW COMPARISON:  CT abdomen and pelvis 10/23/2014 FINDINGS: Prior L5-S1 fusion and L5 laminectomy. Slight prominent stool throughout proximal half of colon. Bowel gas pattern otherwise normal. No bowel dilatation or bowel wall thickening. Lung bases clear. Bones demineralized. No urinary tract calcification. Small pelvic phleboliths noted. Mild atherosclerotic calcification aorta. IMPRESSION: Increased stool in proximal half of colon. No other acute findings. Electronically Signed   By: Lavonia Dana M.D.   On: 07/25/2015 08:32   Ct Chest Wo Contrast  07/25/2015  CLINICAL DATA:  Abnormal chest radiograph with ill defined opacity LEFT lung, history hypertension, COPD, smoker EXAM: CT CHEST WITHOUT CONTRAST TECHNIQUE: Multidetector CT imaging of the chest was performed following the standard protocol without IV contrast. Sagittal and coronal MPR images reconstructed from axial data set. COMPARISON:  None; correlation chest radiograph 07/25/2015 FINDINGS: Minimal atherosclerotic calcification of aorta and coronary arteries. Aorta normal caliber. Scattered normal sized mediastinal lymph nodes. Single upper normal sized subcarinal node 10 mm short axis image 24. Visualized upper abdomen unremarkable. Respiratory motion artifacts degrade exam. Patchy ill-defined airspace infiltrates identified throughout BILATERAL lower lobes and RIGHT upper lobe consistent with pneumonia. Some of these areas of opacity are nodular in appearance, including an area in the LEFT lower lobe 20 x 12 mm in size, as well as an additional 14 mm diameter opacity, corresponding to  chest radiograph finding. Tiny adjacent nodular foci in LEFT upper lobe adjacent to major fissure image 20 also noted. Less significant nodular  appearance of opacities in the RIGHT lower lobe. No pleural effusion or pneumothorax. No obvious endobronchial lesion, though mild peribronchial thickening is seen in both lower lobes. Osseous structures unremarkable. IMPRESSION: Patchy ill-defined airspace infiltrates throughout BILATERAL lower lobes in RIGHT upper lobe consistent with pneumonia. Some of the opacities in particularly LEFT lower lobe are more confluent and nodular in appearance ; favor these to represent confluent infiltrate rather than pulmonary nodules but follow-up CT chest is recommended in 3 months following treatment for pneumonia to ensure resolution and exclude underlying pulmonary nodules/tumor. Electronically Signed   By: Lavonia Dana M.D.   On: 07/25/2015 10:51     Code Status: Full Family Communication: Pt and family at bedside Disposition Plan: Admit for further evaluation    Mart Piggs [facility] [id number]   Review of Systems:  Constitutional: Negative for diaphoresis.  HENT: Negative for hearing loss, ear pain, nosebleeds, congestion, sore throat, neck pain, tinnitus and ear discharge.   Eyes: Negative for blurred vision, double vision, photophobia, pain, discharge and redness.  Respiratory: Per HPI Cardiovascular: Negative for chest pain, palpitations, orthopnea, claudication and leg swelling.  Gastrointestinal: Negative for nausea, vomiting and abdominal pain.  Genitourinary: Negative for dysuria, urgency, frequency, hematuria and flank pain.  Musculoskeletal: Negative for myalgias, back pain, joint pain and falls.  Skin: Negative for itching and rash.  Neurological: Negative for dizziness and weakness Endo/Heme/Allergies: Negative for environmental allergies and polydipsia. Does not bruise/bleed easily.  Psychiatric/Behavioral: Negative for suicidal ideas. The patient is not nervous/anxious.      Past Medical History  Diagnosis Date  . Hypertension   . COPD (chronic obstructive pulmonary disease) ([place])   .  Chronic low back pain   . PNA (pneumonia)     Past Surgical History  Procedure Laterality Date  . Back surgery      Social History:  reports that he has been smoking Cigarettes.  He does not have any smokeless tobacco history on file. He reports that he does not drink alcohol. His drug history is not on file.  Allergies  Allergen Reactions  . Codeine Itching  . Morphine And Related Nausea Only  . Other Other (See Comments)    SSRI's and snri's-- starts shaking.     Family History  Problem Relation Age of Onset  . Hypertension Father     Prior to Admission medications   Medication Sig Start Date End Date Taking? Authorizing Provider  amLODipine-benazepril (LOTREL) 10-40 MG per capsule Take 1 capsule by mouth daily.   Yes Historical Provider, MD  bisoprolol (ZEBETA) 10 MG tablet Take 20 mg by mouth daily.   Yes Historical Provider, MD  budesonide-formoterol (SYMBICORT) 160-4.5 MCG/ACT inhaler Inhale 2 puffs into the lungs daily.   Yes Historical Provider, MD  cloNIDine (CATAPRES) 0.3 MG tablet Take 0.3 mg by mouth 3 (three) times daily.    Yes Historical Provider, MD  fentaNYL (DURAGESIC - DOSED MCG/HR) 100 MCG/HR Place 100 mcg onto the skin every other day.   Yes Historical Provider, MD  gabapentin (NEURONTIN) 600 MG tablet Take 1,200 mg by mouth 4 (four) times daily.   Yes Historical Provider, MD  ibuprofen (ADVIL,MOTRIN) 200 MG tablet Take 400 mg by mouth every 6 (six) hours as needed for headache.   Yes Historical Provider, MD  ipratropium-albuterol (DUONEB) 0.5-2.5 (3) MG/3ML SOLN Take 3 mLs by nebulization every 4 (  four) hours as needed. 08/27/14  Yes Comer Locket, PA-C  ondansetron (ZOFRAN ODT) 4 MG disintegrating tablet 4mg  ODT q4 hours prn nausea/vomit 08/27/14  Yes Benjamin Cartner, PA-C  oxycodone (ROXICODONE) 30 MG immediate release tablet Take 30 mg by mouth 3 (three) times daily.   Yes Historical Provider, MD  pantoprazole (PROTONIX) 40 MG tablet Take 40 mg by mouth  daily.   Yes Historical Provider, MD  PROAIR HFA 108 (90 BASE) MCG/ACT inhaler Inhale 2 puffs into the lungs every 6 (six) hours as needed for wheezing.  07/20/15  Yes Historical Provider, MD  tiotropium (SPIRIVA) 18 MCG inhalation capsule Place 18 mcg into inhaler and inhale daily.   Yes Historical Provider, MD  predniSONE (STERAPRED UNI-PAK) 10 MG tablet Take 6-5-4-3-2-1 tablets by mouth daily till gone. 10/25/14   Kelvin Cellar, MD    Physical Exam: Filed Vitals:   07/25/15 0902 07/25/15 0930 07/25/15 1000 07/25/15 1130  BP: 147/79 159/75 140/97 147/89  Pulse: 99 102 101 98  Temp: 99.7 F (37.6 C)     TempSrc: Oral     Resp: 20   18  Height:      Weight:      SpO2: 96% 89% 93% 94%    Physical Exam  Constitutional: Appears well-developed and well-nourished. No distress.  HENT: Normocephalic. External right and left ear normal. Dry MM Eyes: Conjunctivae and EOM are normal. PERRLA, no scleral icterus.  Neck: Normal ROM. Neck supple. No JVD. No tracheal deviation. No thyromegaly.  CVS: RRR, S1/S2 +, no murmurs, no gallops, no carotid bruit.  Pulmonary: Exp wheezing, diminished breath sounds at bases, tachypnea  Abdominal: Soft. BS +,  no distension, tenderness, rebound or guarding.  Musculoskeletal: Normal range of motion. No edema and no tenderness.  Lymphadenopathy: No lymphadenopathy noted, cervical, inguinal. Neuro: Alert. Normal reflexes, muscle tone coordination. No cranial nerve deficit. Skin: Skin is warm and dry. No rash noted. Not diaphoretic. No erythema. No pallor.  Psychiatric: Normal mood and affect. Behavior, judgment, thought content normal.   Labs on Admission:  Basic Metabolic Panel:  Recent Labs Lab 07/25/15 0847  NA 139  K 3.5  CL 96*  CO2 32  GLUCOSE 128*  BUN 23*  CREATININE 1.12  CALCIUM 9.0   Liver Function Tests:  Recent Labs Lab 07/25/15 0847  AST 18  ALT 15*  ALKPHOS 64  BILITOT 0.5  PROT 7.4  ALBUMIN 4.1    Recent Labs Lab  07/25/15 0847  LIPASE 28   No results for input(s): AMMONIA in the last 168 hours. CBC:  Recent Labs Lab 07/25/15 0847  WBC 25.3*  HGB 13.0  HCT 38.2*  MCV 95.0  PLT 296    EKG: pending    If 7PM-7AM, please contact night-coverage [email or web address] Password [email or web address] 07/25/2015, 11:55 AM

## 2015-07-25 NOTE — Progress Notes (Signed)
Patient request to leave hosp. AMA.  Discussed his treatment plan and need for pain control and antibiotics, states he has meds at home.  Wife at bedside she agrees to take him home.  Dr Doyle Askew notified.

## 2015-07-25 NOTE — ED Notes (Signed)
I-stat lactic ran per RN Faith Anderson Malta

## 2015-07-25 NOTE — ED Notes (Signed)
Removed home Fentanyl patch from right arm. Pt reported no more patches on his body.

## 2015-07-25 NOTE — ED Notes (Signed)
ED PA at bedside

## 2015-07-25 NOTE — ED Notes (Signed)
Patient on 4L at 94%.

## 2015-07-25 NOTE — ED Provider Notes (Signed)
CSN: [id number]     Arrival date & time 07/25/15  0719 History   First MD Initiated Contact with Patient 07/25/15 0725     Chief Complaint  Patient presents with  . Emesis    (Consider location/radiation/quality/duration/timing/severity/associated sxs/prior Treatment) The history is provided by the patient and the spouse. No language interpreter was used.   Mr. Scoggin is a 49 y.o male with a history of pneumonia, hypertension, COPD, and chronic low back pain who presents for nausea, vomiting, generalized abdominal pain, and a headache. He had Zofran last night. His wife states that she noticed that his pulse ox was in the 80s this morning so she decided to bring him to the ED. His wife is worried that he may have aspirated.  He is on oxygen as needed at home due to his COPD. He also says that he has not had a bowel movement in the last 3 days. He has taken MiraLAX with minimal relief. He is also on a 100 mg fentanyl patch which is changed every 2 days and oxycodone for chronic back pain. His wife reports that he gets a headache when he has pneumonia. He is an occasional smoker. He also reports chills but denies any fever, chest pain, diarrhea, hematochezia, hematemesis, or urinary symptoms.   Past Medical History  Diagnosis Date  . Hypertension   . COPD (chronic obstructive pulmonary disease) ([place])   . Chronic low back pain   . PNA (pneumonia)    Past Surgical History  Procedure Laterality Date  . Back surgery     Family History  Problem Relation Age of Onset  . Hypertension Father    Social History  Substance Use Topics  . Smoking status: Current Some Day Smoker    Types: Cigarettes  . Smokeless tobacco: None  . Alcohol Use: No    Review of Systems  Constitutional: Positive for chills. Negative for fever.  Respiratory: Positive for shortness of breath.   Cardiovascular: Negative for chest pain.  Gastrointestinal: Positive for nausea, vomiting and abdominal pain.  All  other systems reviewed and are negative.     Allergies  Codeine; Morphine and related; and Other  Home Medications   Prior to Admission medications   Medication Sig Start Date End Date Taking? Authorizing Provider  amLODipine-benazepril (LOTREL) 10-40 MG per capsule Take 1 capsule by mouth daily.   Yes Historical Provider, MD  bisoprolol (ZEBETA) 10 MG tablet Take 20 mg by mouth daily.   Yes Historical Provider, MD  budesonide-formoterol (SYMBICORT) 160-4.5 MCG/ACT inhaler Inhale 2 puffs into the lungs daily.   Yes Historical Provider, MD  cloNIDine (CATAPRES) 0.3 MG tablet Take 0.3 mg by mouth 3 (three) times daily.    Yes Historical Provider, MD  fentaNYL (DURAGESIC - DOSED MCG/HR) 100 MCG/HR Place 100 mcg onto the skin every other day.   Yes Historical Provider, MD  gabapentin (NEURONTIN) 600 MG tablet Take 1,200 mg by mouth 4 (four) times daily.   Yes Historical Provider, MD  ibuprofen (ADVIL,MOTRIN) 200 MG tablet Take 400 mg by mouth every 6 (six) hours as needed for headache.   Yes Historical Provider, MD  ipratropium-albuterol (DUONEB) 0.5-2.5 (3) MG/3ML SOLN Take 3 mLs by nebulization every 4 (four) hours as needed. 08/27/14  Yes Comer Locket, PA-C  ondansetron (ZOFRAN ODT) 4 MG disintegrating tablet 49m ODT q4 hours prn nausea/vomit 08/27/14  Yes Benjamin Cartner, PA-C  oxycodone (ROXICODONE) 30 MG immediate release tablet Take 30 mg by mouth 3 (three) times daily.  Yes Historical Provider, MD  pantoprazole (PROTONIX) 40 MG tablet Take 40 mg by mouth daily.   Yes Historical Provider, MD  PROAIR HFA 108 (90 BASE) MCG/ACT inhaler Inhale 2 puffs into the lungs every 6 (six) hours as needed for wheezing.  07/20/15  Yes Historical Provider, MD  tiotropium (SPIRIVA) 18 MCG inhalation capsule Place 18 mcg into inhaler and inhale daily.   Yes Historical Provider, MD  predniSONE (STERAPRED UNI-PAK) 10 MG tablet Take 6-5-4-3-2-1 tablets by mouth daily till gone. 10/25/14   Kelvin Cellar, MD    BP 140/97 mmHg  Pulse 101  Temp(Src) 99.7 F (37.6 C) (Oral)  Resp 20  Ht '5\' 5"'  (1.651 m)  Wt 97.07 kg  BMI 35.61 kg/m2  SpO2 93% Physical Exam  Constitutional: He is oriented to person, place, and time. He appears well-developed and well-nourished. He appears ill.  Snoring when I entered the room.  Hypoxic.  HENT:  Head: Normocephalic and atraumatic.  Eyes: Conjunctivae are normal.  Neck: Normal range of motion. Neck supple.  Cardiovascular: Normal rate, regular rhythm and normal heart sounds.   Pulmonary/Chest: Effort normal. He has rhonchi.  Bilateral lower lobe rhonchi. No wheezing. No respiratory distress. 94% oxygen on 4 L at bedside.  Abdominal: Soft. There is no tenderness. There is no rebound and no guarding.  Generalized abdominal tenderness to palpation. No guarding or rebound. Morbidly obese.  Musculoskeletal: Normal range of motion.  Neurological: He is alert and oriented to person, place, and time.  Skin: Skin is warm and dry.  Nursing note and vitals reviewed.   ED Course  Procedures (including critical care time) Labs Review Labs Reviewed  COMPREHENSIVE METABOLIC PANEL - Abnormal; Notable for the following:    Chloride 96 (*)    Glucose, Bld 128 (*)    BUN 23 (*)    ALT 15 (*)    All other components within normal limits  CBC - Abnormal; Notable for the following:    WBC 25.3 (*)    RBC 4.02 (*)    HCT 38.2 (*)    All other components within normal limits  CULTURE, BLOOD (ROUTINE X 2)  CULTURE, BLOOD (ROUTINE X 2)  LIPASE, BLOOD  URINALYSIS, ROUTINE W REFLEX MICROSCOPIC (NOT AT [facility])  URINE RAPID DRUG SCREEN, HOSP PERFORMED  I-STAT CG4 LACTIC ACID, ED    Imaging Review Dg Chest 2 View  07/25/2015  CLINICAL DATA:  Cough and shortness of breath ; decreased oxygen saturation EXAM: CHEST  2 VIEW COMPARISON:  May 14, 2015 and October 30, 2014 FINDINGS: There is chronic interstitial prominence in the right mid and lower lung zones, likely due to  scarring/fibrotic change in these areas. There is no frank edema or consolidation. On the frontal view, there is a 1.8 x 1.1 cm ill-defined opacity in the left lower lung region. Heart size and pulmonary vascularity are normal. No adenopathy. No bone lesions. IMPRESSION: 1.8 x 1.1 cm ill-defined opacity left lower lung region. This finding warrants noncontrast enhanced chest CT to further assess. Areas of interstitial prominence, likely due to mild underlying fibrosis and scarring. No well-defined edema or consolidation. Electronically Signed   By: Lowella Grip III M.D.   On: 07/25/2015 08:32   Dg Abd 1 View  07/25/2015  CLINICAL DATA:  Mid abdominal discomfort worsening over last 3 days, no bowel movement, hypertension, COPD, smoker EXAM: ABDOMEN - 1 VIEW COMPARISON:  CT abdomen and pelvis 10/23/2014 FINDINGS: Prior L5-S1 fusion and L5 laminectomy. Slight prominent stool  throughout proximal half of colon. Bowel gas pattern otherwise normal. No bowel dilatation or bowel wall thickening. Lung bases clear. Bones demineralized. No urinary tract calcification. Small pelvic phleboliths noted. Mild atherosclerotic calcification aorta. IMPRESSION: Increased stool in proximal half of colon. No other acute findings. Electronically Signed   By: Lavonia Dana M.D.   On: 07/25/2015 08:32   Ct Chest Wo Contrast  07/25/2015  CLINICAL DATA:  Abnormal chest radiograph with ill defined opacity LEFT lung, history hypertension, COPD, smoker EXAM: CT CHEST WITHOUT CONTRAST TECHNIQUE: Multidetector CT imaging of the chest was performed following the standard protocol without IV contrast. Sagittal and coronal MPR images reconstructed from axial data set. COMPARISON:  None; correlation chest radiograph 07/25/2015 FINDINGS: Minimal atherosclerotic calcification of aorta and coronary arteries. Aorta normal caliber. Scattered normal sized mediastinal lymph nodes. Single upper normal sized subcarinal node 10 mm short axis image 24.  Visualized upper abdomen unremarkable. Respiratory motion artifacts degrade exam. Patchy ill-defined airspace infiltrates identified throughout BILATERAL lower lobes and RIGHT upper lobe consistent with pneumonia. Some of these areas of opacity are nodular in appearance, including an area in the LEFT lower lobe 20 x 12 mm in size, as well as an additional 14 mm diameter opacity, corresponding to chest radiograph finding. Tiny adjacent nodular foci in LEFT upper lobe adjacent to major fissure image 20 also noted. Less significant nodular appearance of opacities in the RIGHT lower lobe. No pleural effusion or pneumothorax. No obvious endobronchial lesion, though mild peribronchial thickening is seen in both lower lobes. Osseous structures unremarkable. IMPRESSION: Patchy ill-defined airspace infiltrates throughout BILATERAL lower lobes in RIGHT upper lobe consistent with pneumonia. Some of the opacities in particularly LEFT lower lobe are more confluent and nodular in appearance ; favor these to represent confluent infiltrate rather than pulmonary nodules but follow-up CT chest is recommended in 3 months following treatment for pneumonia to ensure resolution and exclude underlying pulmonary nodules/tumor. Electronically Signed   By: Lavonia Dana M.D.   On: 07/25/2015 10:51   I have personally reviewed and evaluated these images and lab results as part of my medical decision-making.   EKG Interpretation None      MDM   Final diagnoses:  Hypoxia  Non-intractable vomiting with nausea, vomiting of unspecified type  Patient presents for shortness of breath, nausea, vomiting, and constipation 3 days. He is ill-appearing and hypoxic on 4 L. He had a leukocytosis of 25. Chest x-ray was negative for pneumonia or edema.He does have a new 1.81.1 cm opacity in the left lower lung region. CT without contrast was recommended by radiology. Abdominal x-ray was done to rule out constipation and showed increased stool in  the proximal half of the colon. An enema was ordered. Patient met sepsis criteria and antibiotics were ordered.  Recheck: Patient refuses rectal temperature. He is still complaining of a headache. Will give Toradol and Reglan. My suspicion for PE is very low. I believe this is a COPD exacerbation versus early pneumonia. His lactic acid is normal. Blood cultures were also ordered and are pending. I discussed this patient with Dr. Ralene Bathe who agrees with the plan. Recheck: I attempted to move the patient from 4 L to 2 L of oxygen and he was still 96% on room air. When trying to move his oxygen to 1 L he began to go down to the 80s. I discussed findings with patient and his wife and they agreed with admission. Medications  sodium phosphate (FLEET) 7-19 GM/118ML enema 1 enema (1  enema Rectal Not Given 07/25/15 0939)  cefTRIAXone (ROCEPHIN) 1 g in dextrose 5 % 50 mL IVPB (1 g Intravenous New Bag/Given 07/25/15 1043)  azithromycin (ZITHROMAX) 500 mg in dextrose 5 % 250 mL IVPB (not administered)  ondansetron (ZOFRAN) injection 4 mg (4 mg Intravenous Given 07/25/15 0848)  sodium chloride 0.9 % bolus 1,000 mL (1,000 mLs Intravenous Rate/Dose Change 07/25/15 1056)  morphine 4 MG/ML injection 4 mg (4 mg Intravenous Given 07/25/15 0849)  methylPREDNISolone sodium succinate (SOLU-MEDROL) 125 mg/2 mL injection 80 mg (80 mg Intravenous Given 07/25/15 1041)  metoCLOPramide (REGLAN) injection 10 mg (10 mg Intravenous Given 07/25/15 1042)  ketorolac (TORADOL) injection 60 mg (60 mg Intramuscular Given 07/25/15 1042)   I spoke to the hospitalist, Dr. Doyle Askew, regarding the need for admission due to hypoxia and possible early pneumonia. She requested that a Zithromax and be added on as well as a urine drug screen.  Ottie Glazier, PA-C 07/25/15 1107  Quintella Reichert, MD 07/25/15 [phone]

## 2015-07-25 NOTE — ED Notes (Signed)
PT DECLINES RECTAL TEMPERATURE AT THIS TIME

## 2015-07-25 NOTE — ED Notes (Signed)
Pt refused rectal temp. Took temp oral. PA Orvil Feil made aware.

## 2015-07-26 NOTE — Progress Notes (Signed)
CSW d/c AMA prior to being seen by CSW.  Werner Lean LCSW [phone]

## 2015-07-30 LAB — CULTURE, BLOOD (ROUTINE X 2)
CULTURE: NO GROWTH
CULTURE: NO GROWTH

## 2015-08-05 NOTE — Discharge Summary (Signed)
Physician Discharge Summary  Trevor Sullivan [id number] DOB: [birth year] DOA: 07/25/2015  PCP: Derrill Center, MD  Admit date: 07/25/2015 Discharge date: 07/25/2015  Recommendations for Outpatient Follow-up:  1. Please note that pt left AMA  Discharge Diagnoses:  Active Problems:   Hypoxia   COPD exacerbation ([place])   Discharge Condition: Not stable  Diet recommendation: not discussed as pt left AMA  History of present illness:   HPI:  Pt is 49 yo male who presented with main concern on several days duration of dyspnea with exertion and at rest, associated with wheezing and cough, mostly non productive, typical of his OCPD flares. Pt reports no chest pain other than when he is coughing but has noted some chills. Pt reports similar episodes in the past typical of his COPD. Please note that pt i spoor historian and says he is to tired now and just wants to go home.   Assessment and Plan: Active Problems: Acute hypoxic respiratory failure with hypoxia  - secondary to acute on chronic COPD exacerbation with ? LLL PNA - pt admitted to SDU and started on solumedrol, BD's scheduled and as needed - pt also started empiric ABX - pt advised to stay in hospital but elects to leave AMA   Procedures/Studies: Dg Chest 2 View  07/25/2015  CLINICAL DATA:  Cough and shortness of breath ; decreased oxygen saturation EXAM: CHEST  2 VIEW COMPARISON:  May 14, 2015 and October 30, 2014 FINDINGS: There is chronic interstitial prominence in the right mid and lower lung zones, likely due to scarring/fibrotic change in these areas. There is no frank edema or consolidation. On the frontal view, there is a 1.8 x 1.1 cm ill-defined opacity in the left lower lung region. Heart size and pulmonary vascularity are normal. No adenopathy. No bone lesions. IMPRESSION: 1.8 x 1.1 cm ill-defined opacity left lower lung region. This finding warrants noncontrast enhanced chest CT to further assess. Areas of  interstitial prominence, likely due to mild underlying fibrosis and scarring. No well-defined edema or consolidation. Electronically Signed   By: Lowella Grip III M.D.   On: 07/25/2015 08:32   Dg Abd 1 View  07/25/2015  CLINICAL DATA:  Mid abdominal discomfort worsening over last 3 days, no bowel movement, hypertension, COPD, smoker EXAM: ABDOMEN - 1 VIEW COMPARISON:  CT abdomen and pelvis 10/23/2014 FINDINGS: Prior L5-S1 fusion and L5 laminectomy. Slight prominent stool throughout proximal half of colon. Bowel gas pattern otherwise normal. No bowel dilatation or bowel wall thickening. Lung bases clear. Bones demineralized. No urinary tract calcification. Small pelvic phleboliths noted. Mild atherosclerotic calcification aorta. IMPRESSION: Increased stool in proximal half of colon. No other acute findings. Electronically Signed   By: Lavonia Dana M.D.   On: 07/25/2015 08:32   Ct Chest Wo Contrast  07/25/2015  CLINICAL DATA:  Abnormal chest radiograph with ill defined opacity LEFT lung, history hypertension, COPD, smoker EXAM: CT CHEST WITHOUT CONTRAST TECHNIQUE: Multidetector CT imaging of the chest was performed following the standard protocol without IV contrast. Sagittal and coronal MPR images reconstructed from axial data set. COMPARISON:  None; correlation chest radiograph 07/25/2015 FINDINGS: Minimal atherosclerotic calcification of aorta and coronary arteries. Aorta normal caliber. Scattered normal sized mediastinal lymph nodes. Single upper normal sized subcarinal node 10 mm short axis image 24. Visualized upper abdomen unremarkable. Respiratory motion artifacts degrade exam. Patchy ill-defined airspace infiltrates identified throughout BILATERAL lower lobes and RIGHT upper lobe consistent with pneumonia. Some of these areas of opacity are nodular in appearance, including  an area in the LEFT lower lobe 20 x 12 mm in size, as well as an additional 14 mm diameter opacity, corresponding to chest  radiograph finding. Tiny adjacent nodular foci in LEFT upper lobe adjacent to major fissure image 20 also noted. Less significant nodular appearance of opacities in the RIGHT lower lobe. No pleural effusion or pneumothorax. No obvious endobronchial lesion, though mild peribronchial thickening is seen in both lower lobes. Osseous structures unremarkable. IMPRESSION: Patchy ill-defined airspace infiltrates throughout BILATERAL lower lobes in RIGHT upper lobe consistent with pneumonia. Some of the opacities in particularly LEFT lower lobe are more confluent and nodular in appearance ; favor these to represent confluent infiltrate rather than pulmonary nodules but follow-up CT chest is recommended in 3 months following treatment for pneumonia to ensure resolution and exclude underlying pulmonary nodules/tumor. Electronically Signed   By: Lavonia Dana M.D.   On: 07/25/2015 10:51    Discharge Exam: Filed Vitals:   07/25/15 1546 07/25/15 1600  BP: 147/76   Pulse: 86   Temp:  97.4 F (36.3 C)  Resp: 11    Filed Vitals:   07/25/15 1320 07/25/15 1417 07/25/15 1546 07/25/15 1600  BP: 142/74  147/76   Pulse: 87  86   Temp:    97.4 F (36.3 C)  TempSrc:    Oral  Resp: 14  11   Height:      Weight:      SpO2: 93% 92% 96%     General: Pt is alert, wants to leave   Discharge Instructions    Medication List    ASK your doctor about these medications        amLODipine-benazepril 10-40 MG capsule  Commonly known as:  LOTREL  Take 1 capsule by mouth daily.     bisoprolol 10 MG tablet  Commonly known as:  ZEBETA  Take 20 mg by mouth daily.     budesonide-formoterol 160-4.5 MCG/ACT inhaler  Commonly known as:  SYMBICORT  Inhale 2 puffs into the lungs daily.     cloNIDine 0.3 MG tablet  Commonly known as:  CATAPRES  Take 0.3 mg by mouth 3 (three) times daily.     fentaNYL 100 MCG/HR  Commonly known as:  DURAGESIC - dosed mcg/hr  Place 100 mcg onto the skin every other day.      gabapentin 600 MG tablet  Commonly known as:  NEURONTIN  Take 1,200 mg by mouth 4 (four) times daily.     ibuprofen 200 MG tablet  Commonly known as:  ADVIL,MOTRIN  Take 400 mg by mouth every 6 (six) hours as needed for headache.     ipratropium-albuterol 0.5-2.5 (3) MG/3ML Soln  Commonly known as:  DUONEB  Take 3 mLs by nebulization every 4 (four) hours as needed.     ondansetron 4 MG disintegrating tablet  Commonly known as:  ZOFRAN ODT  4mg  ODT q4 hours prn nausea/vomit     oxycodone 30 MG immediate release tablet  Commonly known as:  ROXICODONE  Take 30 mg by mouth 3 (three) times daily.     pantoprazole 40 MG tablet  Commonly known as:  PROTONIX  Take 40 mg by mouth daily.     predniSONE 10 MG tablet  Commonly known as:  STERAPRED UNI-PAK  Take 6-5-4-3-2-1 tablets by mouth daily till gone.     PROAIR HFA 108 (90 BASE) MCG/ACT inhaler  Generic drug:  albuterol  Inhale 2 puffs into the lungs every 6 (six) hours as needed  for wheezing.     tiotropium 18 MCG inhalation capsule  Commonly known as:  SPIRIVA  Place 18 mcg into inhaler and inhale daily.          The results of significant diagnostics from this hospitalization (including imaging, microbiology, ancillary and laboratory) are listed below for reference.     Microbiology: No results found for this or any previous visit (from the past 240 hour(s)).   Labs: Basic Metabolic Panel: No results for input(s): NA, K, CL, CO2, GLUCOSE, BUN, CREATININE, CALCIUM, MG, PHOS in the last 168 hours. Liver Function Tests: No results for input(s): AST, ALT, ALKPHOS, BILITOT, PROT, ALBUMIN in the last 168 hours. No results for input(s): LIPASE, AMYLASE in the last 168 hours. No results for input(s): AMMONIA in the last 168 hours. CBC: No results for input(s): WBC, NEUTROABS, HGB, HCT, MCV, PLT in the last 168 hours. Cardiac Enzymes: No results for input(s): CKTOTAL, CKMB, CKMBINDEX, TROPONINI in the last 168  hours. BNP: BNP (last 3 results) No results for input(s): BNP in the last 8760 hours.  ProBNP (last 3 results) No results for input(s): PROBNP in the last 8760 hours.  CBG: No results for input(s): GLUCAP in the last 168 hours.   SIGNED: Time coordinating discharge:   Faye Ramsay, MD  [facility] 08/05/2015, 8:51 PM Pager [phone]  If 7PM-7AM, please contact night-coverage [email or web address] Password [email or web address]

## 2015-08-17 DIAGNOSIS — J189 Pneumonia, unspecified organism: Secondary | ICD-10-CM | POA: Diagnosis not present

## 2015-09-21 DIAGNOSIS — M5126 Other intervertebral disc displacement, lumbar region: Secondary | ICD-10-CM | POA: Diagnosis not present

## 2015-09-21 DIAGNOSIS — M5412 Radiculopathy, cervical region: Secondary | ICD-10-CM | POA: Diagnosis not present

## 2015-09-21 DIAGNOSIS — M5020 Other cervical disc displacement, unspecified cervical region: Secondary | ICD-10-CM | POA: Diagnosis not present

## 2015-09-21 DIAGNOSIS — M545 Low back pain: Secondary | ICD-10-CM | POA: Diagnosis not present

## 2015-10-01 DIAGNOSIS — K5900 Constipation, unspecified: Secondary | ICD-10-CM | POA: Diagnosis not present

## 2015-10-01 DIAGNOSIS — M5440 Lumbago with sciatica, unspecified side: Secondary | ICD-10-CM | POA: Diagnosis not present

## 2015-10-01 DIAGNOSIS — G894 Chronic pain syndrome: Secondary | ICD-10-CM | POA: Diagnosis not present

## 2015-10-02 DIAGNOSIS — M545 Low back pain: Secondary | ICD-10-CM | POA: Diagnosis not present

## 2015-10-02 DIAGNOSIS — G8929 Other chronic pain: Secondary | ICD-10-CM | POA: Diagnosis not present

## 2015-10-08 DIAGNOSIS — R0602 Shortness of breath: Secondary | ICD-10-CM | POA: Diagnosis not present

## 2015-10-08 DIAGNOSIS — J441 Chronic obstructive pulmonary disease with (acute) exacerbation: Secondary | ICD-10-CM | POA: Diagnosis not present

## 2015-10-08 DIAGNOSIS — J45901 Unspecified asthma with (acute) exacerbation: Secondary | ICD-10-CM | POA: Diagnosis not present

## 2015-10-29 DIAGNOSIS — J441 Chronic obstructive pulmonary disease with (acute) exacerbation: Secondary | ICD-10-CM | POA: Diagnosis not present

## 2015-10-29 DIAGNOSIS — R911 Solitary pulmonary nodule: Secondary | ICD-10-CM | POA: Diagnosis not present

## 2015-10-29 DIAGNOSIS — J690 Pneumonitis due to inhalation of food and vomit: Secondary | ICD-10-CM | POA: Diagnosis not present

## 2015-10-29 DIAGNOSIS — R938 Abnormal findings on diagnostic imaging of other specified body structures: Secondary | ICD-10-CM | POA: Diagnosis not present

## 2015-10-29 DIAGNOSIS — J45901 Unspecified asthma with (acute) exacerbation: Secondary | ICD-10-CM | POA: Diagnosis not present

## 2015-10-29 DIAGNOSIS — J189 Pneumonia, unspecified organism: Secondary | ICD-10-CM | POA: Diagnosis not present

## 2015-10-29 DIAGNOSIS — G4710 Hypersomnia, unspecified: Secondary | ICD-10-CM | POA: Diagnosis not present

## 2015-11-05 DIAGNOSIS — M545 Low back pain: Secondary | ICD-10-CM | POA: Diagnosis not present

## 2015-11-05 DIAGNOSIS — M5412 Radiculopathy, cervical region: Secondary | ICD-10-CM | POA: Diagnosis not present

## 2015-11-05 DIAGNOSIS — M549 Dorsalgia, unspecified: Secondary | ICD-10-CM | POA: Diagnosis not present

## 2015-11-05 DIAGNOSIS — M5126 Other intervertebral disc displacement, lumbar region: Secondary | ICD-10-CM | POA: Diagnosis not present

## 2015-11-05 DIAGNOSIS — M5020 Other cervical disc displacement, unspecified cervical region: Secondary | ICD-10-CM | POA: Diagnosis not present

## 2015-11-05 DIAGNOSIS — M25551 Pain in right hip: Secondary | ICD-10-CM | POA: Diagnosis not present

## 2015-11-05 DIAGNOSIS — G8929 Other chronic pain: Secondary | ICD-10-CM | POA: Diagnosis not present

## 2015-11-05 DIAGNOSIS — M25552 Pain in left hip: Secondary | ICD-10-CM | POA: Diagnosis not present

## 2016-02-05 DIAGNOSIS — M2550 Pain in unspecified joint: Secondary | ICD-10-CM | POA: Diagnosis not present

## 2016-02-05 DIAGNOSIS — M5126 Other intervertebral disc displacement, lumbar region: Secondary | ICD-10-CM | POA: Diagnosis not present

## 2016-02-05 DIAGNOSIS — M5412 Radiculopathy, cervical region: Secondary | ICD-10-CM | POA: Diagnosis not present

## 2016-02-05 DIAGNOSIS — M5020 Other cervical disc displacement, unspecified cervical region: Secondary | ICD-10-CM | POA: Diagnosis not present

## 2016-02-05 DIAGNOSIS — M545 Low back pain: Secondary | ICD-10-CM | POA: Diagnosis not present

## 2016-02-05 DIAGNOSIS — G8929 Other chronic pain: Secondary | ICD-10-CM | POA: Diagnosis not present

## 2016-02-25 DIAGNOSIS — K210 Gastro-esophageal reflux disease with esophagitis: Secondary | ICD-10-CM | POA: Diagnosis not present

## 2016-02-25 DIAGNOSIS — B9689 Other specified bacterial agents as the cause of diseases classified elsewhere: Secondary | ICD-10-CM | POA: Diagnosis not present

## 2016-02-25 DIAGNOSIS — J208 Acute bronchitis due to other specified organisms: Secondary | ICD-10-CM | POA: Diagnosis not present

## 2016-02-25 DIAGNOSIS — I10 Essential (primary) hypertension: Secondary | ICD-10-CM | POA: Diagnosis not present

## 2016-02-25 DIAGNOSIS — J449 Chronic obstructive pulmonary disease, unspecified: Secondary | ICD-10-CM | POA: Diagnosis not present

## 2016-03-10 DIAGNOSIS — M545 Low back pain: Secondary | ICD-10-CM | POA: Diagnosis not present

## 2016-03-10 DIAGNOSIS — G8929 Other chronic pain: Secondary | ICD-10-CM | POA: Diagnosis not present

## 2016-05-15 IMAGING — CR DG ABDOMEN 1V
2 series · 2 of 2 positions shown · non-contrast
Comparison: CT abdomen and pelvis 10/23/2014

CLINICAL DATA: Mid abdominal discomfort worsening over last 3 days,
no bowel movement, hypertension, COPD, smoker

EXAM:
ABDOMEN - 1 VIEW

[t abdomen supine (1 of 2)]
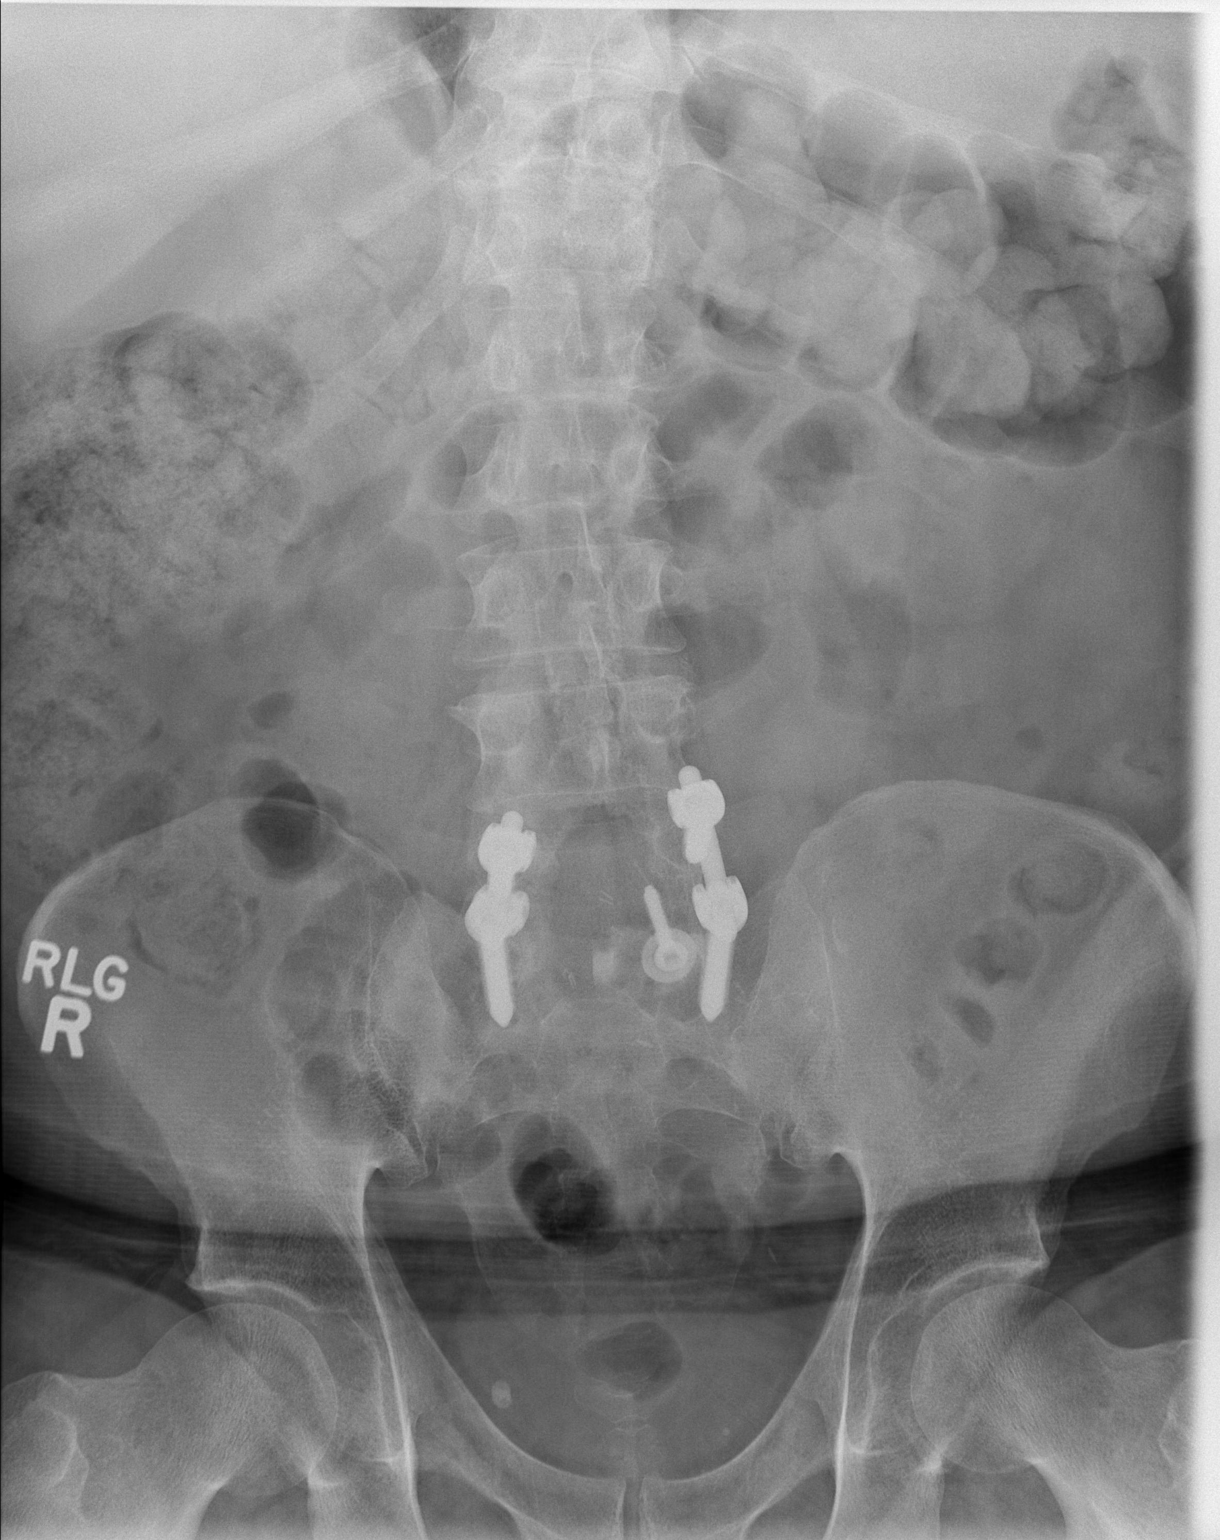

[t abdomen supine (2 of 2)]
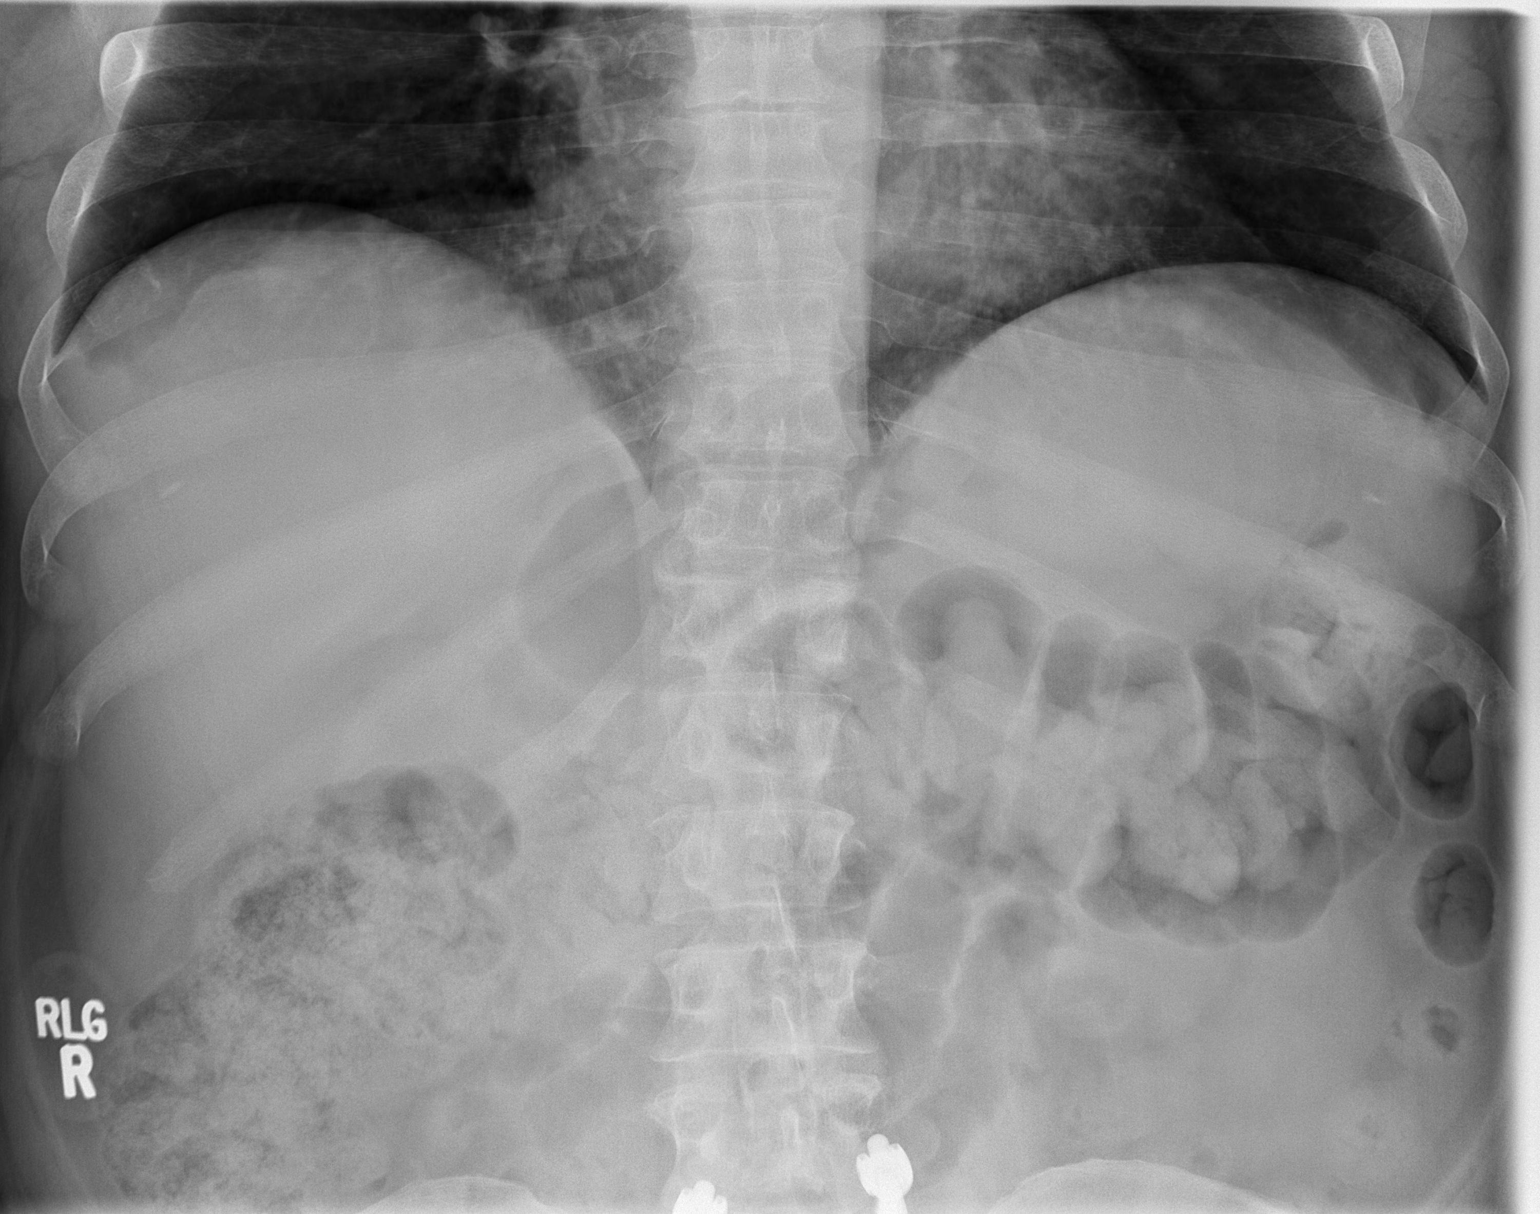

[2 of 2 positions shown; findings below may reference images not displayed]

FINDINGS: Prior L5-S1 fusion and L5 laminectomy.

Slight prominent stool throughout proximal half of colon.

Bowel gas pattern otherwise normal.

No bowel dilatation or bowel wall thickening.

Lung bases clear.

Bones demineralized.

No urinary tract calcification.

Small pelvic phleboliths noted.

Mild atherosclerotic calcification aorta.
IMPRESSION: Increased stool in proximal half of colon.

No other acute findings.

## 2016-05-15 IMAGING — CR DG CHEST 2V
2 series · 2 of 2 positions shown · non-contrast
Comparison: May 14, 2015 and October 30, 2014

CLINICAL DATA: Cough and shortness of breath ; decreased oxygen
saturation

EXAM:
CHEST  2 VIEW

[w chest pa]
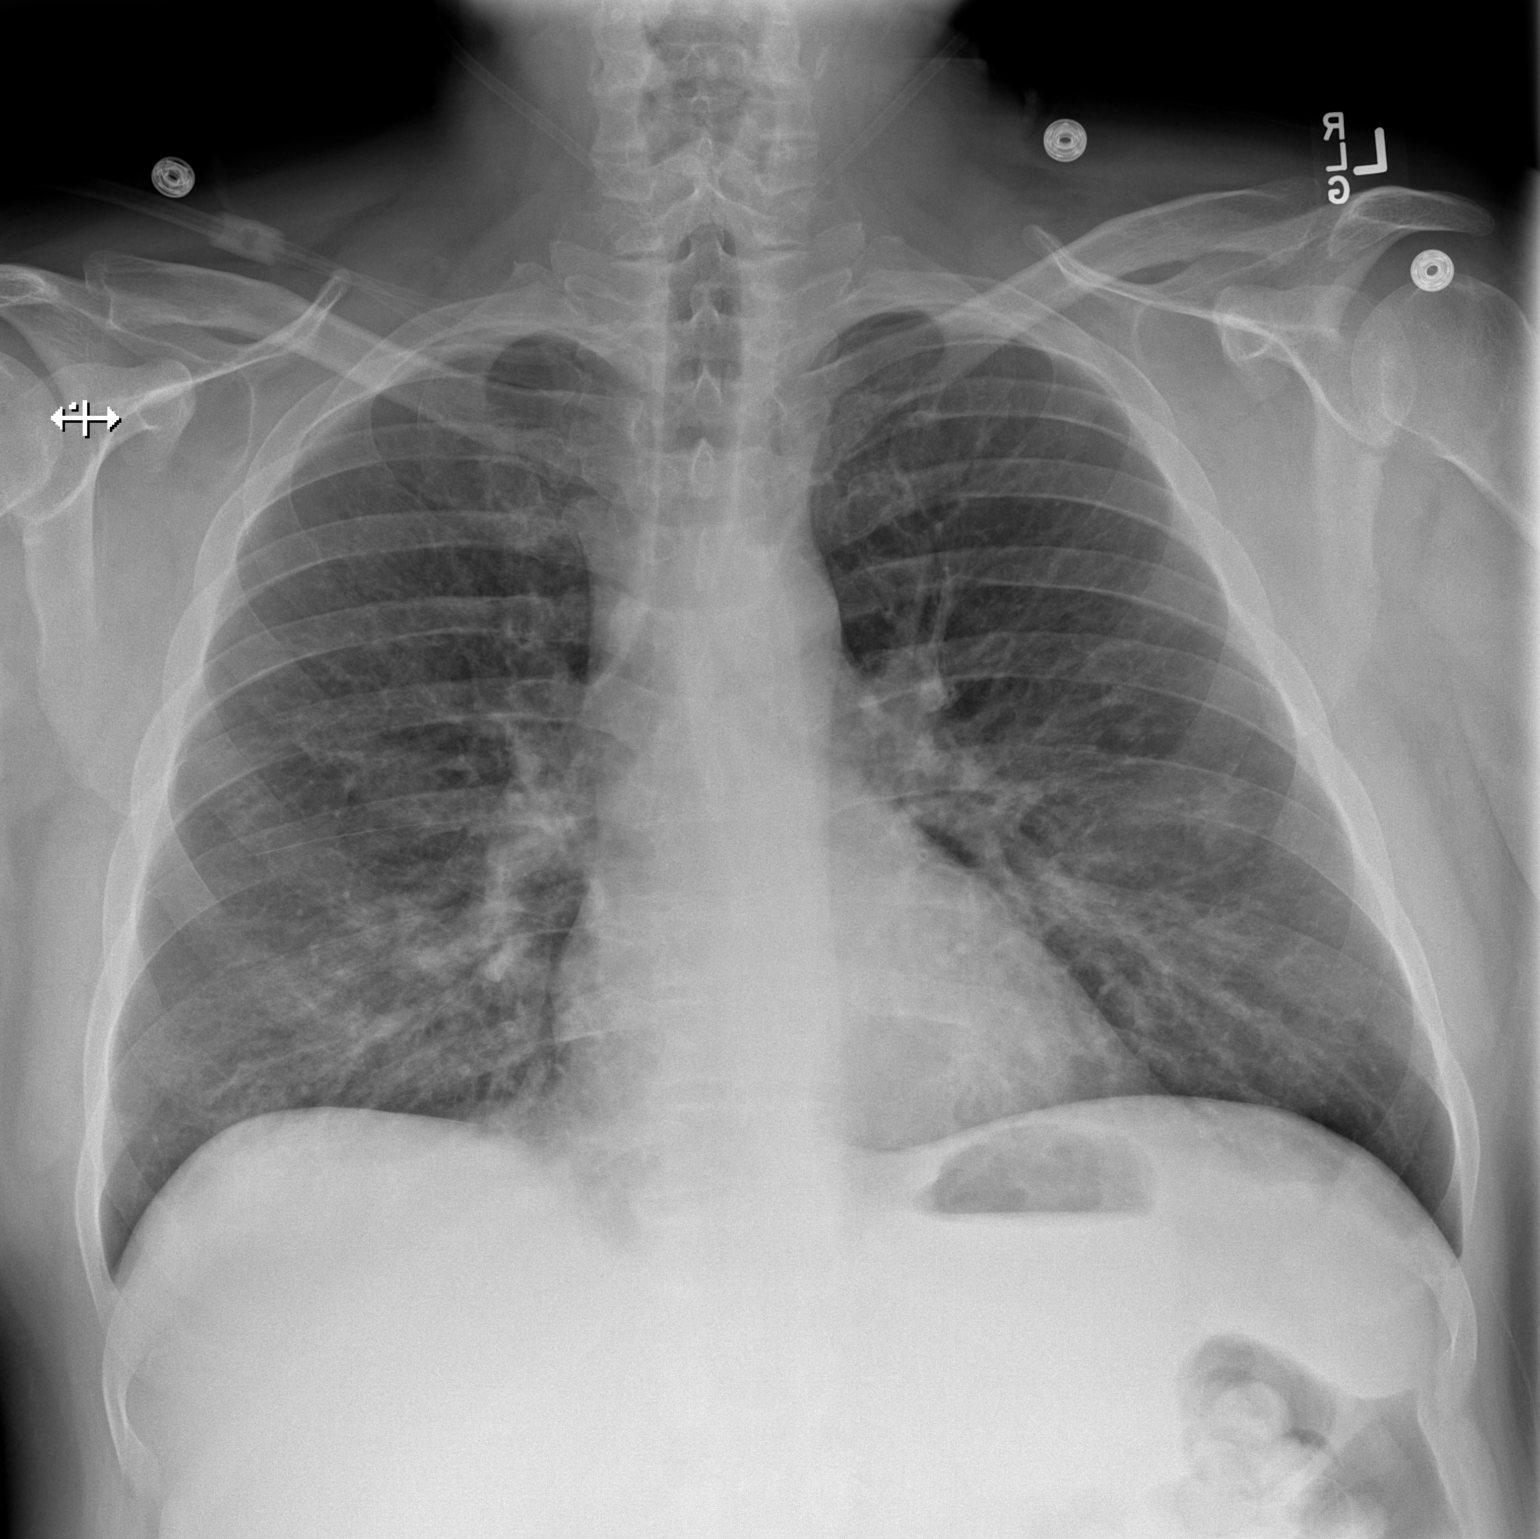

[w chest lat]
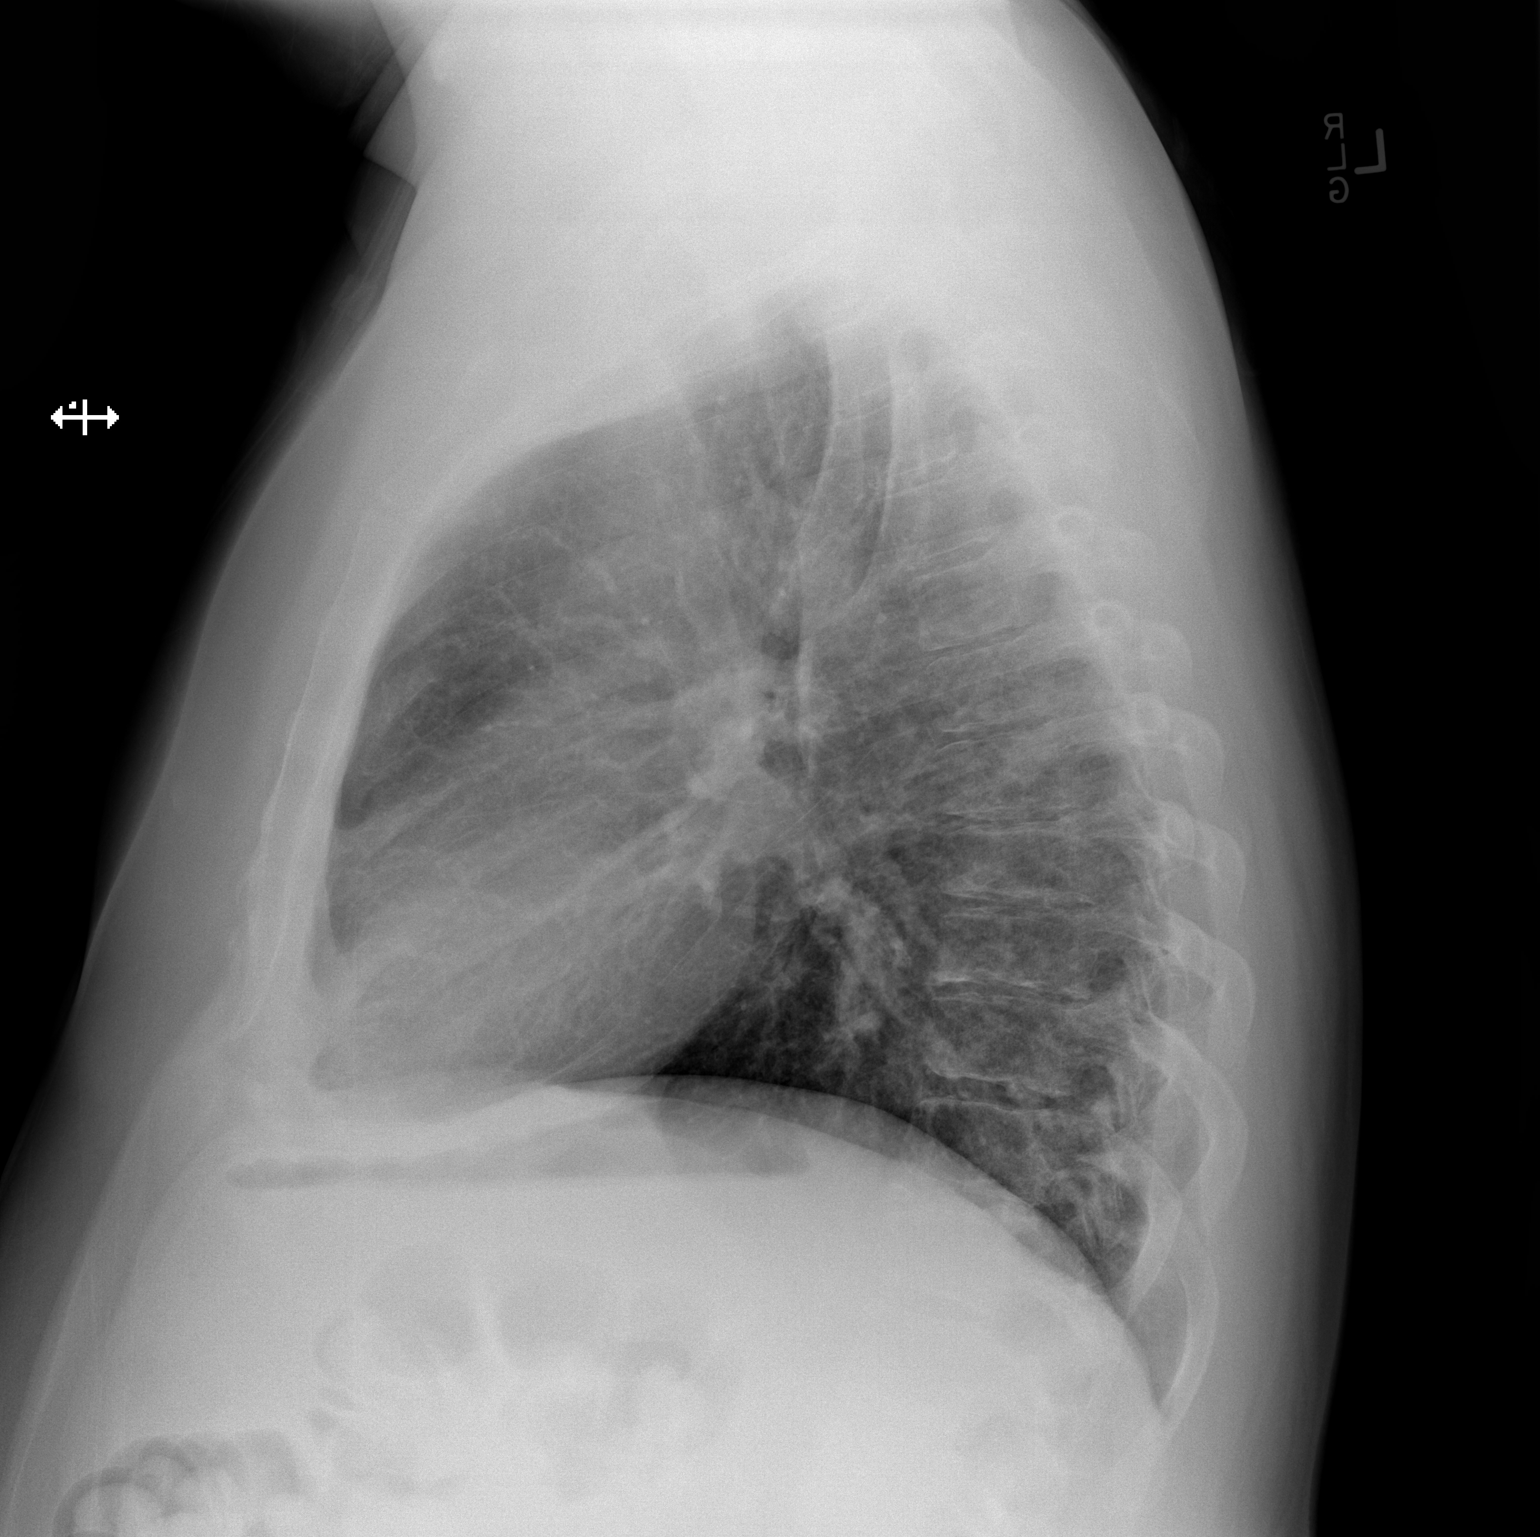

[2 of 2 positions shown; findings below may reference images not displayed]

FINDINGS: There is chronic interstitial prominence in the right mid and lower
lung zones, likely due to scarring/fibrotic change in these areas.
There is no frank edema or consolidation.

On the frontal view, there is a 1.8 x 1.1 cm ill-defined opacity in
the left lower lung region.

Heart size and pulmonary vascularity are normal. No adenopathy. No
bone lesions.
IMPRESSION: 1.8 x 1.1 cm ill-defined opacity left lower lung region. This
finding warrants noncontrast enhanced chest CT to further assess.

Areas of interstitial prominence, likely due to mild underlying
fibrosis and scarring.

No well-defined edema or consolidation.

## 2016-07-10 DIAGNOSIS — M5126 Other intervertebral disc displacement, lumbar region: Secondary | ICD-10-CM | POA: Diagnosis not present

## 2016-07-10 DIAGNOSIS — M5412 Radiculopathy, cervical region: Secondary | ICD-10-CM | POA: Diagnosis not present

## 2016-07-10 DIAGNOSIS — M545 Low back pain: Secondary | ICD-10-CM | POA: Diagnosis not present

## 2016-07-10 DIAGNOSIS — G8929 Other chronic pain: Secondary | ICD-10-CM | POA: Diagnosis not present

## 2016-07-10 DIAGNOSIS — M5020 Other cervical disc displacement, unspecified cervical region: Secondary | ICD-10-CM | POA: Diagnosis not present

## 2016-10-06 DIAGNOSIS — M545 Low back pain: Secondary | ICD-10-CM | POA: Diagnosis not present

## 2016-10-06 DIAGNOSIS — G8929 Other chronic pain: Secondary | ICD-10-CM | POA: Diagnosis not present

## 2016-10-06 DIAGNOSIS — M5126 Other intervertebral disc displacement, lumbar region: Secondary | ICD-10-CM | POA: Diagnosis not present

## 2016-10-13 DIAGNOSIS — R5383 Other fatigue: Secondary | ICD-10-CM | POA: Diagnosis not present

## 2016-10-13 DIAGNOSIS — F909 Attention-deficit hyperactivity disorder, unspecified type: Secondary | ICD-10-CM | POA: Diagnosis not present

## 2016-10-13 DIAGNOSIS — I10 Essential (primary) hypertension: Secondary | ICD-10-CM | POA: Diagnosis not present

## 2016-10-13 DIAGNOSIS — J449 Chronic obstructive pulmonary disease, unspecified: Secondary | ICD-10-CM | POA: Diagnosis not present

## 2016-10-13 DIAGNOSIS — R110 Nausea: Secondary | ICD-10-CM | POA: Diagnosis not present

## 2016-10-13 DIAGNOSIS — F17200 Nicotine dependence, unspecified, uncomplicated: Secondary | ICD-10-CM | POA: Diagnosis not present

## 2016-11-03 DIAGNOSIS — M5126 Other intervertebral disc displacement, lumbar region: Secondary | ICD-10-CM | POA: Diagnosis not present

## 2016-11-03 DIAGNOSIS — M5020 Other cervical disc displacement, unspecified cervical region: Secondary | ICD-10-CM | POA: Diagnosis not present

## 2016-11-03 DIAGNOSIS — G8929 Other chronic pain: Secondary | ICD-10-CM | POA: Diagnosis not present

## 2016-11-03 DIAGNOSIS — M545 Low back pain: Secondary | ICD-10-CM | POA: Diagnosis not present

## 2016-11-03 DIAGNOSIS — R110 Nausea: Secondary | ICD-10-CM | POA: Diagnosis not present

## 2016-12-02 DIAGNOSIS — F17210 Nicotine dependence, cigarettes, uncomplicated: Secondary | ICD-10-CM | POA: Diagnosis not present

## 2016-12-02 DIAGNOSIS — M5124 Other intervertebral disc displacement, thoracic region: Secondary | ICD-10-CM | POA: Diagnosis not present

## 2016-12-02 DIAGNOSIS — M5410 Radiculopathy, site unspecified: Secondary | ICD-10-CM | POA: Diagnosis not present

## 2016-12-02 DIAGNOSIS — G8929 Other chronic pain: Secondary | ICD-10-CM | POA: Diagnosis not present

## 2016-12-02 DIAGNOSIS — G4700 Insomnia, unspecified: Secondary | ICD-10-CM | POA: Diagnosis not present

## 2016-12-02 DIAGNOSIS — M419 Scoliosis, unspecified: Secondary | ICD-10-CM | POA: Diagnosis not present

## 2016-12-02 DIAGNOSIS — M545 Low back pain: Secondary | ICD-10-CM | POA: Diagnosis not present

## 2016-12-02 DIAGNOSIS — F419 Anxiety disorder, unspecified: Secondary | ICD-10-CM | POA: Diagnosis not present

## 2016-12-02 DIAGNOSIS — M5126 Other intervertebral disc displacement, lumbar region: Secondary | ICD-10-CM | POA: Diagnosis not present

## 2016-12-02 DIAGNOSIS — R2689 Other abnormalities of gait and mobility: Secondary | ICD-10-CM | POA: Diagnosis not present

## 2016-12-08 ENCOUNTER — Emergency Department (HOSPITAL_COMMUNITY): Payer: PRIVATE HEALTH INSURANCE

## 2016-12-08 ENCOUNTER — Inpatient Hospital Stay (HOSPITAL_COMMUNITY)
Admission: EM | Admit: 2016-12-08 | Discharge: 2016-12-12 | DRG: 438 | Discharge status: Against Medical Advice | Payer: PRIVATE HEALTH INSURANCE | Attending: Pulmonary Disease | Admitting: Pulmonary Disease

## 2016-12-08 ENCOUNTER — Encounter (HOSPITAL_COMMUNITY): Payer: Self-pay | Admitting: Nurse Practitioner

## 2016-12-08 ENCOUNTER — Inpatient Hospital Stay (HOSPITAL_COMMUNITY): Payer: PRIVATE HEALTH INSURANCE

## 2016-12-08 DIAGNOSIS — I160 Hypertensive urgency: Secondary | ICD-10-CM | POA: Diagnosis present

## 2016-12-08 DIAGNOSIS — R451 Restlessness and agitation: Secondary | ICD-10-CM | POA: Diagnosis not present

## 2016-12-08 DIAGNOSIS — K5900 Constipation, unspecified: Secondary | ICD-10-CM | POA: Diagnosis present

## 2016-12-08 DIAGNOSIS — G9340 Encephalopathy, unspecified: Secondary | ICD-10-CM | POA: Diagnosis present

## 2016-12-08 DIAGNOSIS — K8501 Idiopathic acute pancreatitis with uninfected necrosis: Secondary | ICD-10-CM | POA: Diagnosis not present

## 2016-12-08 DIAGNOSIS — E781 Pure hyperglyceridemia: Secondary | ICD-10-CM | POA: Diagnosis present

## 2016-12-08 DIAGNOSIS — E669 Obesity, unspecified: Secondary | ICD-10-CM | POA: Diagnosis not present

## 2016-12-08 DIAGNOSIS — Z8379 Family history of other diseases of the digestive system: Secondary | ICD-10-CM

## 2016-12-08 DIAGNOSIS — G8929 Other chronic pain: Secondary | ICD-10-CM | POA: Diagnosis present

## 2016-12-08 DIAGNOSIS — I10 Essential (primary) hypertension: Secondary | ICD-10-CM | POA: Diagnosis not present

## 2016-12-08 DIAGNOSIS — Z888 Allergy status to other drugs, medicaments and biological substances status: Secondary | ICD-10-CM

## 2016-12-08 DIAGNOSIS — K8500 Idiopathic acute pancreatitis without necrosis or infection: Secondary | ICD-10-CM | POA: Diagnosis not present

## 2016-12-08 DIAGNOSIS — D72829 Elevated white blood cell count, unspecified: Secondary | ICD-10-CM

## 2016-12-08 DIAGNOSIS — R109 Unspecified abdominal pain: Secondary | ICD-10-CM | POA: Diagnosis present

## 2016-12-08 DIAGNOSIS — D649 Anemia, unspecified: Secondary | ICD-10-CM | POA: Diagnosis present

## 2016-12-08 DIAGNOSIS — M545 Low back pain: Secondary | ICD-10-CM | POA: Diagnosis present

## 2016-12-08 DIAGNOSIS — E6609 Other obesity due to excess calories: Secondary | ICD-10-CM | POA: Diagnosis not present

## 2016-12-08 DIAGNOSIS — E876 Hypokalemia: Secondary | ICD-10-CM

## 2016-12-08 DIAGNOSIS — Z7951 Long term (current) use of inhaled steroids: Secondary | ICD-10-CM | POA: Diagnosis not present

## 2016-12-08 DIAGNOSIS — Z6839 Body mass index (BMI) 39.0-39.9, adult: Secondary | ICD-10-CM

## 2016-12-08 DIAGNOSIS — Z7982 Long term (current) use of aspirin: Secondary | ICD-10-CM

## 2016-12-08 DIAGNOSIS — Z885 Allergy status to narcotic agent status: Secondary | ICD-10-CM | POA: Diagnosis not present

## 2016-12-08 DIAGNOSIS — K859 Acute pancreatitis without necrosis or infection, unspecified: Secondary | ICD-10-CM | POA: Diagnosis present

## 2016-12-08 DIAGNOSIS — Z981 Arthrodesis status: Secondary | ICD-10-CM

## 2016-12-08 DIAGNOSIS — F419 Anxiety disorder, unspecified: Secondary | ICD-10-CM | POA: Diagnosis present

## 2016-12-08 DIAGNOSIS — Z79891 Long term (current) use of opiate analgesic: Secondary | ICD-10-CM | POA: Diagnosis not present

## 2016-12-08 DIAGNOSIS — J449 Chronic obstructive pulmonary disease, unspecified: Secondary | ICD-10-CM | POA: Diagnosis present

## 2016-12-08 DIAGNOSIS — K219 Gastro-esophageal reflux disease without esophagitis: Secondary | ICD-10-CM | POA: Diagnosis present

## 2016-12-08 DIAGNOSIS — Z79899 Other long term (current) drug therapy: Secondary | ICD-10-CM | POA: Diagnosis not present

## 2016-12-08 DIAGNOSIS — G894 Chronic pain syndrome: Secondary | ICD-10-CM | POA: Diagnosis not present

## 2016-12-08 DIAGNOSIS — Z8249 Family history of ischemic heart disease and other diseases of the circulatory system: Secondary | ICD-10-CM | POA: Diagnosis not present

## 2016-12-08 DIAGNOSIS — F17210 Nicotine dependence, cigarettes, uncomplicated: Secondary | ICD-10-CM | POA: Diagnosis present

## 2016-12-08 DIAGNOSIS — N179 Acute kidney failure, unspecified: Secondary | ICD-10-CM

## 2016-12-08 DIAGNOSIS — Z781 Physical restraint status: Secondary | ICD-10-CM

## 2016-12-08 DIAGNOSIS — R112 Nausea with vomiting, unspecified: Secondary | ICD-10-CM | POA: Diagnosis not present

## 2016-12-08 DIAGNOSIS — R1084 Generalized abdominal pain: Secondary | ICD-10-CM | POA: Diagnosis not present

## 2016-12-08 LAB — BASIC METABOLIC PANEL
ANION GAP: 11 (ref 5–15)
BUN: 28 mg/dL — ABNORMAL HIGH (ref 6–20)
CALCIUM: 6.9 mg/dL — AB (ref 8.9–10.3)
CO2: 27 mmol/L (ref 22–32)
CREATININE: 1.2 mg/dL (ref 0.61–1.24)
Chloride: 100 mmol/L — ABNORMAL LOW (ref 101–111)
GFR calc Af Amer: >60 mL/min (ref >60)
Glucose, Bld: 118 mg/dL — ABNORMAL HIGH (ref 65–99)
Potassium: 2.8 mmol/L — ABNORMAL LOW (ref 3.5–5.1)
Sodium: 138 mmol/L (ref 135–145)

## 2016-12-08 LAB — CBC WITH DIFFERENTIAL/PLATELET
Basophils Absolute: 0 10*3/uL (ref 0.0–0.1)
Basophils Relative: 0 %
EOS ABS: 0 10*3/uL (ref 0.0–0.7)
EOS PCT: 0 %
HCT: 38 % — ABNORMAL LOW (ref 39.0–52.0)
Hemoglobin: 13.7 g/dL (ref 13.0–17.0)
LYMPHS ABS: 2 10*3/uL (ref 0.7–4.0)
Lymphocytes Relative: 7 %
MCH: 33.6 pg (ref 26.0–34.0)
MCHC: 36.1 g/dL — AB (ref 30.0–36.0)
MCV: 93.1 fL (ref 78.0–100.0)
MONO ABS: 0.6 10*3/uL (ref 0.1–1.0)
Monocytes Relative: 2 %
NEUTROS ABS: 25.6 10*3/uL — AB (ref 1.7–7.7)
NEUTROS PCT: 91 %
Platelets: 373 10*3/uL (ref 150–400)
RBC: 4.08 MIL/uL — ABNORMAL LOW (ref 4.22–5.81)
RDW: 13.7 % (ref 11.5–15.5)
WBC: 28.2 10*3/uL — ABNORMAL HIGH (ref 4.0–10.5)

## 2016-12-08 LAB — URINALYSIS, ROUTINE W REFLEX MICROSCOPIC
BACTERIA UA: NONE SEEN
BILIRUBIN URINE: NEGATIVE
GLUCOSE, UA: NEGATIVE mg/dL
KETONES UR: NEGATIVE mg/dL
LEUKOCYTES UA: NEGATIVE
Nitrite: NEGATIVE
Protein, ur: 30 mg/dL — AB
Specific Gravity, Urine: 1.032 — ABNORMAL HIGH (ref 1.005–1.030)
Squamous Epithelial / LPF: NONE SEEN
pH: 5 (ref 5.0–8.0)

## 2016-12-08 LAB — COMPREHENSIVE METABOLIC PANEL
ALT: 41 U/L (ref 17–63)
ANION GAP: 15 (ref 5–15)
AST: 43 U/L — ABNORMAL HIGH (ref 15–41)
Albumin: 4.4 g/dL (ref 3.5–5.0)
Alkaline Phosphatase: 69 U/L (ref 38–126)
BUN: 35 mg/dL — AB (ref 6–20)
CO2: 28 mmol/L (ref 22–32)
CREATININE: 1.53 mg/dL — AB (ref 0.61–1.24)
Calcium: 8 mg/dL — ABNORMAL LOW (ref 8.9–10.3)
Chloride: 96 mmol/L — ABNORMAL LOW (ref 101–111)
GFR, EST NON AFRICAN AMERICAN: 52 mL/min — AB (ref >60)
Glucose, Bld: 142 mg/dL — ABNORMAL HIGH (ref 65–99)
POTASSIUM: 2.6 mmol/L — AB (ref 3.5–5.1)
Sodium: 139 mmol/L (ref 135–145)
TOTAL PROTEIN: 7.8 g/dL (ref 6.5–8.1)
Total Bilirubin: 0.9 mg/dL (ref 0.3–1.2)

## 2016-12-08 LAB — CREATININE, SERUM
Creatinine, Ser: 1.02 mg/dL (ref 0.61–1.24)
GFR calc Af Amer: >60 mL/min (ref >60)

## 2016-12-08 LAB — LIPASE, BLOOD: LIPASE: 1261 U/L — AB (ref 11–51)

## 2016-12-08 LAB — TRIGLYCERIDES: Triglycerides: 485 mg/dL — ABNORMAL HIGH (ref <150)

## 2016-12-08 LAB — MRSA PCR SCREENING: MRSA by PCR: NEGATIVE

## 2016-12-08 LAB — MAGNESIUM: Magnesium: 1.7 mg/dL (ref 1.7–2.4)

## 2016-12-08 MED ORDER — MOMETASONE FURO-FORMOTEROL FUM 200-5 MCG/ACT IN AERO
2.0000 | INHALATION_SPRAY | Freq: Two times a day (BID) | RESPIRATORY_TRACT | Status: DC
  Administered 2016-12-09 – 2016-12-12 (×5): 2 via RESPIRATORY_TRACT
  Filled 2016-12-08: qty 8.8

## 2016-12-08 MED ORDER — ACETAMINOPHEN 650 MG RE SUPP: 650.0000 mg | Freq: Four times a day (QID) | RECTAL | Status: DC | PRN

## 2016-12-08 MED ORDER — HYDROMORPHONE HCL 2 MG/ML IJ SOLN
1.0000 mg | Freq: Once | INTRAMUSCULAR | Status: AC
  Administered 2016-12-08: 1 mg via INTRAVENOUS
  Filled 2016-12-08: qty 1

## 2016-12-08 MED ORDER — SODIUM CHLORIDE 0.9 % IV BOLUS (SEPSIS)
1000.0000 mL | Freq: Once | INTRAVENOUS | Status: AC
  Administered 2016-12-08: 1000 mL via INTRAVENOUS

## 2016-12-08 MED ORDER — HYDROMORPHONE HCL 1 MG/ML PO LIQD: 1.0000 mg | ORAL | Status: DC | PRN

## 2016-12-08 MED ORDER — ALBUTEROL SULFATE HFA 108 (90 BASE) MCG/ACT IN AERS: 2.0000 | INHALATION_SPRAY | Freq: Four times a day (QID) | RESPIRATORY_TRACT | Status: DC | PRN

## 2016-12-08 MED ORDER — LORAZEPAM 2 MG/ML IJ SOLN
0.5000 mg | Freq: Once | INTRAMUSCULAR | Status: AC
  Administered 2016-12-08: 0.5 mg via INTRAVENOUS
  Filled 2016-12-08: qty 1

## 2016-12-08 MED ORDER — TIOTROPIUM BROMIDE MONOHYDRATE 18 MCG IN CAPS
18.0000 ug | ORAL_CAPSULE | Freq: Every day | RESPIRATORY_TRACT | Status: DC
  Administered 2016-12-09 – 2016-12-12 (×4): 18 ug via RESPIRATORY_TRACT
  Filled 2016-12-08: qty 5

## 2016-12-08 MED ORDER — ONDANSETRON HCL 4 MG/2ML IJ SOLN
4.0000 mg | Freq: Four times a day (QID) | INTRAMUSCULAR | Status: DC | PRN
  Filled 2016-12-08: qty 2

## 2016-12-08 MED ORDER — POTASSIUM CHLORIDE 10 MEQ/100ML IV SOLN
10.0000 meq | INTRAVENOUS | Status: AC
  Administered 2016-12-08 – 2016-12-09 (×3): 10 meq via INTRAVENOUS
  Filled 2016-12-08 (×3): qty 100

## 2016-12-08 MED ORDER — SODIUM CHLORIDE 0.9 % IV BOLUS (SEPSIS)
500.0000 mL | Freq: Once | INTRAVENOUS | Status: AC
  Administered 2016-12-08: 500 mL via INTRAVENOUS

## 2016-12-08 MED ORDER — SODIUM CHLORIDE 0.9 % IV SOLN: 30.0000 meq | Freq: Once | INTRAVENOUS | Status: DC

## 2016-12-08 MED ORDER — KCL IN DEXTROSE-NACL 20-5-0.9 MEQ/L-%-% IV SOLN
INTRAVENOUS | Status: DC
  Administered 2016-12-08: 125 mL/h via INTRAVENOUS
  Filled 2016-12-08: qty 1000

## 2016-12-08 MED ORDER — SODIUM CHLORIDE 0.9 % IV SOLN
1.0000 g | Freq: Three times a day (TID) | INTRAVENOUS | Status: DC
  Administered 2016-12-08 – 2016-12-10 (×5): 1 g via INTRAVENOUS
  Filled 2016-12-08 (×6): qty 1

## 2016-12-08 MED ORDER — IPRATROPIUM-ALBUTEROL 0.5-2.5 (3) MG/3ML IN SOLN
3.0000 mL | RESPIRATORY_TRACT | Status: DC | PRN
  Administered 2016-12-11 – 2016-12-12 (×3): 3 mL via RESPIRATORY_TRACT
  Filled 2016-12-08 (×3): qty 3

## 2016-12-08 MED ORDER — ACETAMINOPHEN 325 MG PO TABS
650.0000 mg | ORAL_TABLET | Freq: Four times a day (QID) | ORAL | Status: DC | PRN
  Administered 2016-12-09 – 2016-12-11 (×2): 650 mg via ORAL
  Filled 2016-12-08 (×2): qty 2

## 2016-12-08 MED ORDER — MAGNESIUM SULFATE 2 GM/50ML IV SOLN
2.0000 g | Freq: Once | INTRAVENOUS | Status: AC
  Administered 2016-12-08: 2 g via INTRAVENOUS
  Filled 2016-12-08: qty 50

## 2016-12-08 MED ORDER — IOPAMIDOL (ISOVUE-300) INJECTION 61%: 30.0000 mL | Freq: Once | INTRAVENOUS | Status: DC | PRN

## 2016-12-08 MED ORDER — POTASSIUM CHLORIDE 2 MEQ/ML IV SOLN: 30.0000 meq | Freq: Once | INTRAVENOUS | Status: DC

## 2016-12-08 MED ORDER — ONDANSETRON HCL 4 MG/2ML IJ SOLN
4.0000 mg | Freq: Once | INTRAMUSCULAR | Status: AC
  Administered 2016-12-08: 4 mg via INTRAVENOUS
  Filled 2016-12-08: qty 2

## 2016-12-08 MED ORDER — FENTANYL 25 MCG/HR TD PT72: 25.0000 ug | MEDICATED_PATCH | TRANSDERMAL | Status: DC

## 2016-12-08 MED ORDER — LORAZEPAM 2 MG/ML IJ SOLN
0.5000 mg | Freq: Four times a day (QID) | INTRAMUSCULAR | Status: DC | PRN
  Administered 2016-12-08 – 2016-12-09 (×4): 0.5 mg via INTRAVENOUS
  Filled 2016-12-08 (×5): qty 1

## 2016-12-08 MED ORDER — ALBUTEROL SULFATE (2.5 MG/3ML) 0.083% IN NEBU
2.5000 mg | INHALATION_SOLUTION | Freq: Four times a day (QID) | RESPIRATORY_TRACT | Status: DC | PRN
  Administered 2016-12-11 (×2): 2.5 mg via RESPIRATORY_TRACT
  Filled 2016-12-08 (×2): qty 3

## 2016-12-08 MED ORDER — ONDANSETRON HCL 4 MG PO TABS: 4.0000 mg | ORAL_TABLET | Freq: Four times a day (QID) | ORAL | Status: DC | PRN

## 2016-12-08 MED ORDER — SODIUM CHLORIDE 0.9 % IV SOLN
1.0000 g | INTRAVENOUS | Status: AC
  Administered 2016-12-08: 1 g via INTRAVENOUS
  Filled 2016-12-08: qty 1

## 2016-12-08 MED ORDER — SODIUM CHLORIDE 0.9 % IV SOLN
INTRAVENOUS | Status: DC
  Administered 2016-12-08: 18:00:00 via INTRAVENOUS

## 2016-12-08 MED ORDER — HYDROMORPHONE HCL 2 MG/ML IJ SOLN
2.0000 mg | INTRAMUSCULAR | Status: DC | PRN
  Administered 2016-12-08 (×2): 2 mg via INTRAVENOUS
  Administered 2016-12-08 – 2016-12-09 (×3): 3 mg via INTRAVENOUS
  Filled 2016-12-08 (×2): qty 2
  Filled 2016-12-08: qty 1
  Filled 2016-12-08: qty 2
  Filled 2016-12-08: qty 1

## 2016-12-08 MED ORDER — PANTOPRAZOLE SODIUM 40 MG IV SOLR
40.0000 mg | Freq: Two times a day (BID) | INTRAVENOUS | Status: DC
  Administered 2016-12-08 – 2016-12-10 (×4): 40 mg via INTRAVENOUS
  Filled 2016-12-08 (×4): qty 40

## 2016-12-08 MED ORDER — CLONIDINE HCL 0.2 MG/24HR TD PTWK
0.2000 mg | MEDICATED_PATCH | TRANSDERMAL | Status: DC
  Administered 2016-12-08: 0.2 mg via TRANSDERMAL
  Filled 2016-12-08: qty 1

## 2016-12-08 MED ORDER — PANTOPRAZOLE SODIUM 40 MG IV SOLR
40.0000 mg | Freq: Once | INTRAVENOUS | Status: AC
  Administered 2016-12-08: 40 mg via INTRAVENOUS
  Filled 2016-12-08: qty 40

## 2016-12-08 MED ORDER — HEPARIN SODIUM (PORCINE) 5000 UNIT/ML IJ SOLN
5000.0000 [IU] | Freq: Three times a day (TID) | INTRAMUSCULAR | Status: DC
  Administered 2016-12-08 – 2016-12-12 (×10): 5000 [IU] via SUBCUTANEOUS
  Filled 2016-12-08 (×10): qty 1

## 2016-12-08 MED ORDER — IOPAMIDOL (ISOVUE-300) INJECTION 61%
INTRAVENOUS | Status: AC
  Administered 2016-12-08: 11:00:00
  Filled 2016-12-08: qty 30

## 2016-12-08 MED ORDER — IOPAMIDOL (ISOVUE-300) INJECTION 61%
100.0000 mL | Freq: Once | INTRAVENOUS | Status: AC | PRN
  Administered 2016-12-08: 100 mL via INTRAVENOUS

## 2016-12-08 MED ORDER — POTASSIUM CHLORIDE 10 MEQ/100ML IV SOLN
10.0000 meq | INTRAVENOUS | Status: AC
  Administered 2016-12-08 (×3): 10 meq via INTRAVENOUS
  Filled 2016-12-08 (×3): qty 100

## 2016-12-08 MED ORDER — BISACODYL 10 MG RE SUPP: 10.0000 mg | Freq: Every day | RECTAL | Status: DC | PRN

## 2016-12-08 MED ORDER — HYDRALAZINE HCL 20 MG/ML IJ SOLN
10.0000 mg | Freq: Four times a day (QID) | INTRAMUSCULAR | Status: DC | PRN
  Administered 2016-12-09: 10 mg via INTRAVENOUS
  Filled 2016-12-08 (×2): qty 1

## 2016-12-08 MED ORDER — IOPAMIDOL (ISOVUE-300) INJECTION 61%
INTRAVENOUS | Status: AC
  Administered 2016-12-08: 12:00:00
  Filled 2016-12-08: qty 100

## 2016-12-08 NOTE — ED Provider Notes (Signed)
[facility] DEPT Provider Note   CSN: [id number] Arrival date & time: 12/08/16  1022     History   Chief Complaint Chief Complaint  Patient presents with  . Abdominal Pain  . Constipation  . Emesis    HPI Janthony Holleman is a 51 y.o. male.  Patient complains of abdominal pain nausea vomiting started yesterday   The history is provided by the patient. No language interpreter was used.  Abdominal Pain   This is a new problem. The current episode started 2 days ago. The problem occurs constantly. The problem has not changed since onset.The pain is associated with an unknown factor. The pain is located in the generalized abdominal region and epigastric region. The pain is at a severity of 7/10. Associated symptoms include vomiting and constipation. Pertinent negatives include diarrhea, frequency, hematuria and headaches.  Constipation   Associated symptoms include abdominal pain.  Emesis   Associated symptoms include abdominal pain. Pertinent negatives include no cough, no diarrhea and no headaches.    Past Medical History:  Diagnosis Date  . Chronic low back pain   . COPD (chronic obstructive pulmonary disease) ([place])   . Hypertension   . PNA (pneumonia)     Patient Active Problem List   Diagnosis Date Noted  . Hypoxia 07/25/2015  . COPD exacerbation ([place]) 07/25/2015  . Abdominal pain 10/23/2014  . Acute respiratory failure ([place]) 10/23/2014  . Essential hypertension 10/23/2014  . H/O spinal fusion 10/23/2014  . Chronic pain 10/23/2014  . GERD (gastroesophageal reflux disease) 10/23/2014  . Nausea and vomiting 10/23/2014  . Community acquired pneumonia   . Dehydration     Past Surgical History:  Procedure Laterality Date  . BACK SURGERY         Home Medications    Prior to Admission medications   Medication Sig Start Date End Date Taking? Authorizing Provider  amLODipine-benazepril (LOTREL) 10-40 MG per capsule Take 1 capsule by mouth daily.   Yes  Historical Provider, MD  aspirin EC 81 MG tablet Take 324 mg by mouth once.   Yes Historical Provider, MD  bisoprolol (ZEBETA) 10 MG tablet Take 20 mg by mouth daily.   Yes Historical Provider, MD  budesonide-formoterol (SYMBICORT) 160-4.5 MCG/ACT inhaler Inhale 2 puffs into the lungs daily.   Yes Historical Provider, MD  cloNIDine (CATAPRES) 0.3 MG tablet Take 0.3 mg by mouth 3 (three) times daily.    Yes Historical Provider, MD  clotrimazole (MYCELEX) 10 MG troche Take 10 mg by mouth 3 (three) times daily. 12/07/16  Yes Historical Provider, MD  fentaNYL (DURAGESIC - DOSED MCG/HR) 25 MCG/HR patch Place 25 mcg onto the skin every 3 (three) days.   Yes Historical Provider, MD  gabapentin (NEURONTIN) 600 MG tablet Take 1,200 mg by mouth 4 (four) times daily.   Yes Historical Provider, MD  ibuprofen (ADVIL,MOTRIN) 200 MG tablet Take 400 mg by mouth every 6 (six) hours as needed for headache.   Yes Historical Provider, MD  ipratropium-albuterol (DUONEB) 0.5-2.5 (3) MG/3ML SOLN Take 3 mLs by nebulization every 4 (four) hours as needed. Patient taking differently: Take 3 mLs by nebulization every 6 (six) hours as needed (Short of breath).  08/27/14  Yes Benjamin Cartner, PA-C  methocarbamol (ROBAXIN) 750 MG tablet Take 750 mg by mouth 4 (four) times daily. 11/12/16  Yes Historical Provider, MD  ondansetron (ZOFRAN ODT) 4 MG disintegrating tablet 4mg  ODT q4 hours prn nausea/vomit 08/27/14  Yes Comer Locket, PA-C  oxycodone (ROXICODONE) 30 MG immediate release  tablet Take 30 mg by mouth 3 (three) times daily.   Yes Historical Provider, MD  pantoprazole (PROTONIX) 40 MG tablet Take 40 mg by mouth daily.   Yes Historical Provider, MD  PROAIR HFA 108 (90 BASE) MCG/ACT inhaler Inhale 2 puffs into the lungs every 6 (six) hours as needed for wheezing.  07/20/15  Yes Historical Provider, MD  tiotropium (SPIRIVA) 18 MCG inhalation capsule Place 18 mcg into inhaler and inhale daily.   Yes Historical Provider, MD    predniSONE (STERAPRED UNI-PAK) 10 MG tablet Take 6-5-4-3-2-1 tablets by mouth daily till gone. Patient not taking: Reported on 12/08/2016 10/25/14   Kelvin Cellar, MD    Family History Family History  Problem Relation Age of Onset  . Hypertension Father     Social History Social History  Substance Use Topics  . Smoking status: Current Some Day Smoker    Types: Cigarettes  . Smokeless tobacco: Never Used  . Alcohol use No     Allergies   Codeine; Morphine and related; and Other   Review of Systems Review of Systems  Constitutional: Negative for appetite change and fatigue.  HENT: Negative for congestion, ear discharge and sinus pressure.   Eyes: Negative for discharge.  Respiratory: Negative for cough.   Cardiovascular: Negative for chest pain.  Gastrointestinal: Positive for abdominal pain, constipation and vomiting. Negative for diarrhea.  Genitourinary: Negative for frequency and hematuria.  Musculoskeletal: Negative for back pain.  Skin: Negative for rash.  Neurological: Negative for seizures and headaches.  Psychiatric/Behavioral: Negative for hallucinations.     Physical Exam Updated Vital Signs BP (!) 179/98   Pulse 99   Temp 98.1 F (36.7 C) (Oral)   Resp (!) 24   SpO2 94%   Physical Exam  Constitutional: He is oriented to person, place, and time. He appears well-developed.  HENT:  Head: Normocephalic.  Eyes: Conjunctivae and EOM are normal. No scleral icterus.  Neck: Neck supple. No thyromegaly present.  Cardiovascular: Normal rate and regular rhythm.  Exam reveals no gallop and no friction rub.   No murmur heard. Pulmonary/Chest: No stridor. He has no wheezes. He has no rales. He exhibits no tenderness.  Abdominal: He exhibits no distension. There is tenderness. There is no rebound.  Musculoskeletal: Normal range of motion. He exhibits no edema.  Lymphadenopathy:    He has no cervical adenopathy.  Neurological: He is oriented to person, place,  and time. He exhibits normal muscle tone. Coordination normal.  Skin: No rash noted. No erythema.  Psychiatric: He has a normal mood and affect. His behavior is normal.     ED Treatments / Results  Labs (all labs ordered are listed, but only abnormal results are displayed) Labs Reviewed  CBC WITH DIFFERENTIAL/PLATELET - Abnormal; Notable for the following:       Result Value   WBC 28.2 (*)    RBC 4.08 (*)    HCT 38.0 (*)    MCHC 36.1 (*)    Neutro Abs 25.6 (*)    All other components within normal limits  COMPREHENSIVE METABOLIC PANEL - Abnormal; Notable for the following:    Potassium 2.6 (*)    Chloride 96 (*)    Glucose, Bld 142 (*)    BUN 35 (*)    Creatinine, Ser 1.53 (*)    Calcium 8.0 (*)    AST 43 (*)    GFR calc non Af Amer 52 (*)    All other components within normal limits  LIPASE, BLOOD -  Abnormal; Notable for the following:    Lipase 1,261 (*)    All other components within normal limits  URINALYSIS, ROUTINE W REFLEX MICROSCOPIC - Abnormal; Notable for the following:    Specific Gravity, Urine 1.032 (*)    Hgb urine dipstick MODERATE (*)    Protein, ur 30 (*)    All other components within normal limits  TRIGLYCERIDES    EKG  EKG Interpretation None       Radiology Ct Abdomen Pelvis W Contrast  Result Date: 12/08/2016 CLINICAL DATA:  Generalized abdominal Pain EXAM: CT ABDOMEN AND PELVIS WITH CONTRAST TECHNIQUE: Multidetector CT imaging of the abdomen and pelvis was performed using the standard protocol following bolus administration of intravenous contrast. CONTRAST:  159mL ISOVUE-300 IOPAMIDOL (ISOVUE-300) INJECTION 61% COMPARISON:  10/23/2014 FINDINGS: Lower chest: No acute abnormality. Hepatobiliary: There is no focal liver abnormality. The gallbladder appears within normal limits. No biliary dilatation. Pancreas: There is diffuse edema and inflammation involving the pancreas compatible with acute pancreatitis. No evidence for pancreatic necrosis and  no focal fluid collections identified at this time. Spleen: Normal in size without focal abnormality. Adrenals/Urinary Tract: The adrenal glands are normal. Unremarkable appearance of the left kidney. 8 mm exophytic lesion arising from the upper pole of right kidney is too small to characterize. No mass or hydronephrosis. Urinary bladder appears normal. Stomach/Bowel: The stomach is normal. The small bowel loops have a normal course and caliber. No obstruction. The appendix is visualized and is unremarkable. No pathologic dilatation of the colon. Vascular/Lymphatic: Aortic atherosclerosis. No aneurysm. No upper abdominal adenopathy. No pelvic or inguinal adenopathy. Reproductive: Prostate is unremarkable. Other: There is edema/inflammation extending into the transverse mesocolon and small bowel mesentary. Free fluid extends into the retroperitoneal space and along the right pericolic gutter into the right iliac fossa. No well defined enhancing fluid collections identified at this time. Musculoskeletal: No aggressive lytic or sclerotic bone lesions. Status post anterior posterior fixation of L5-S1. IMPRESSION: 1. Imaging findings compatible with acute pancreatitis. There is marked edema and inflammation associated with the pancreas which extends into the transverse mesocolon, small bowel mesenterary and retroperitoneal space. 2. No well defined fluid collections identified to suggest pseudocyst. No pancreatic necrosis identified at this time. Electronically Signed   By: Kerby Moors M.D.   On: 12/08/2016 13:50    Procedures Procedures (including critical care time)  Medications Ordered in ED Medications  iopamidol (ISOVUE-300) 61 % injection (not administered)  iopamidol (ISOVUE-300) 61 % injection (not administered)  potassium chloride 10 mEq in 100 mL IVPB (10 mEq Intravenous New Bag/Given 12/08/16 1345)  sodium chloride 0.9 % bolus 1,000 mL (not administered)  HYDROmorphone (DILAUDID) injection 1 mg  (1 mg Intravenous Given 12/08/16 1106)  ondansetron (ZOFRAN) injection 4 mg (4 mg Intravenous Given 12/08/16 1106)  pantoprazole (PROTONIX) injection 40 mg (40 mg Intravenous Given 12/08/16 1135)  HYDROmorphone (DILAUDID) injection 1 mg (1 mg Intravenous Given 12/08/16 1245)  ondansetron (ZOFRAN) injection 4 mg (4 mg Intravenous Given 12/08/16 1246)  LORazepam (ATIVAN) injection 0.5 mg (0.5 mg Intravenous Given 12/08/16 1246)  iopamidol (ISOVUE-300) 61 % injection 100 mL (100 mLs Intravenous Contrast Given 12/08/16 1312)     Initial Impression / Assessment and Plan / ED Course  I have reviewed the triage vital signs and the nursing notes.  Pertinent labs & imaging results that were available during my care of the patient were reviewed by me and considered in my medical decision making (see chart for details).  CRITICAL CARE Performed by: Airam Heidecker L Total critical care time: 40 minutes Critical care time was exclusive of separately billable procedures and treating other patients. Critical care was necessary to treat or prevent imminent or life-threatening deterioration. Critical care was time spent personally by me on the following activities: development of treatment plan with patient and/or surrogate as well as nursing, discussions with consultants, evaluation of patient's response to treatment, examination of patient, obtaining history from patient or surrogate, ordering and performing treatments and interventions, ordering and review of laboratory studies, ordering and review of radiographic studies, pulse oximetry and re-evaluation of patient's condition.   Final Clinical Impressions(s) / ED Diagnoses   Final diagnoses:  Idiopathic acute pancreatitis without infection or necrosis   Patient with new pancreatitis and hypokalemia he will be admitted to telemetry New Prescriptions New Prescriptions   No medications on file     Milton Ferguson, MD 12/08/16 1458

## 2016-12-08 NOTE — ED Notes (Signed)
Patient screaming and grabbing stomach in pain. Yelling "I have fentanyl all over my chart, fentanyl is the only thing that will work for me" educated patient about pain medication and a therapeutic treatment for his diagnosis. Patient states understanding. Spouse at bedside.

## 2016-12-08 NOTE — ED Triage Notes (Signed)
Pt presents to [facility] via [facility] from home for complaints of abdominal pain (generalized) that started last night with associated nausea and vomiting. Pt rates pain 10/10 and actively dry heaving. Hypertensive with EMS, hx of hptn. Says wife who is PA did not auscultate bowel sounds and was concerned for bowel obstruction. Pt was given 243mcg of fentanyl IV with EMS. Pt was wearing fentanyl patch which was removed by EMS. [facility] to [facility] for management of back pain.

## 2016-12-08 NOTE — ED Notes (Signed)
MD notified of pts request. Will put in verbal orders.

## 2016-12-08 NOTE — ED Notes (Signed)
Bed: WA02 Expected date:  Expected time:  Means of arrival:  Comments: EMS 51 yo abd pain, triage

## 2016-12-08 NOTE — ED Notes (Signed)
Pt requested IV in his antecubital. Cautioned that he will not be able to bend his arm. He said 'ok'.

## 2016-12-08 NOTE — ED Notes (Signed)
Pt angered about additional pain medication order. Demands something to 'relax him.'

## 2016-12-08 NOTE — ED Notes (Signed)
MD notified of pts request for additional pain control

## 2016-12-08 NOTE — Consult Note (Signed)
Referring Provider: Dr. Roderic Palau Primary Care Physician:  Derrill Center, MD Primary Gastroenterologist:  Althia Forts  Reason for Consultation:  Pancreatitis  HPI: Trevor Sullivan is a 51 y.o. male with the acute onset of profuse nausea and vomiting and severe epigastric and periumbilical abdominal pain for the past 2 days. Pain is constant and sharp. Denies hematemesis, melena, hematochezia, diarrhea. Has been having constipation recently. Lipase 1,261. CT scan showed acute pancreatitis without a pseudocyst. No pancreatic necrosis. Gallbladder normal and no gallstones. No biliary dilation. TB 0.9, ALP 69, AST 43, ALT 41. Denies any previous history of pancreatitis. Denies alcohol now or in the past. Denies NSAIDs. Denies any new meds.    Past Medical History:  Diagnosis Date  . Chronic low back pain   . COPD (chronic obstructive pulmonary disease) ([place])   . Hypertension   . PNA (pneumonia)     Past Surgical History:  Procedure Laterality Date  . BACK SURGERY      Prior to Admission medications   Medication Sig Start Date End Date Taking? Authorizing Provider  amLODipine-benazepril (LOTREL) 10-40 MG per capsule Take 1 capsule by mouth daily.   Yes Historical Provider, MD  aspirin EC 81 MG tablet Take 324 mg by mouth once.   Yes Historical Provider, MD  bisoprolol (ZEBETA) 10 MG tablet Take 20 mg by mouth daily.   Yes Historical Provider, MD  budesonide-formoterol (SYMBICORT) 160-4.5 MCG/ACT inhaler Inhale 2 puffs into the lungs daily.   Yes Historical Provider, MD  cloNIDine (CATAPRES) 0.3 MG tablet Take 0.3 mg by mouth 3 (three) times daily.    Yes Historical Provider, MD  clotrimazole (MYCELEX) 10 MG troche Take 10 mg by mouth 3 (three) times daily. 12/07/16  Yes Historical Provider, MD  fentaNYL (DURAGESIC - DOSED MCG/HR) 25 MCG/HR patch Place 25 mcg onto the skin every 3 (three) days.   Yes Historical Provider, MD  gabapentin (NEURONTIN) 600 MG tablet Take 1,200 mg by mouth 4 (four)  times daily.   Yes Historical Provider, MD  ibuprofen (ADVIL,MOTRIN) 200 MG tablet Take 400 mg by mouth every 6 (six) hours as needed for headache.   Yes Historical Provider, MD  ipratropium-albuterol (DUONEB) 0.5-2.5 (3) MG/3ML SOLN Take 3 mLs by nebulization every 4 (four) hours as needed. Patient taking differently: Take 3 mLs by nebulization every 6 (six) hours as needed (Short of breath).  08/27/14  Yes Benjamin Cartner, PA-C  methocarbamol (ROBAXIN) 750 MG tablet Take 750 mg by mouth 4 (four) times daily. 11/12/16  Yes Historical Provider, MD  ondansetron (ZOFRAN ODT) 4 MG disintegrating tablet 4mg  ODT q4 hours prn nausea/vomit 08/27/14  Yes Comer Locket, PA-C  oxycodone (ROXICODONE) 30 MG immediate release tablet Take 30 mg by mouth 3 (three) times daily.   Yes Historical Provider, MD  pantoprazole (PROTONIX) 40 MG tablet Take 40 mg by mouth daily.   Yes Historical Provider, MD  PROAIR HFA 108 (90 BASE) MCG/ACT inhaler Inhale 2 puffs into the lungs every 6 (six) hours as needed for wheezing.  07/20/15  Yes Historical Provider, MD  tiotropium (SPIRIVA) 18 MCG inhalation capsule Place 18 mcg into inhaler and inhale daily.   Yes Historical Provider, MD  predniSONE (STERAPRED UNI-PAK) 10 MG tablet Take 6-5-4-3-2-1 tablets by mouth daily till gone. Patient not taking: Reported on 12/08/2016 10/25/14   Kelvin Cellar, MD    Scheduled Meds: . cloNIDine  0.2 mg Transdermal Weekly  . iopamidol      . iopamidol      .  pantoprazole (PROTONIX) IV  40 mg Intravenous Q12H   Continuous Infusions: . sodium chloride    . meropenem (MERREM) IV    . [START ON 12/09/2016] meropenem (MERREM) IV    . potassium chloride 10 mEq (12/08/16 1649)   PRN Meds:.hydrALAZINE, HYDROmorphone (DILAUDID) injection, LORazepam  Allergies as of 12/08/2016 - Review Complete 12/08/2016  Allergen Reaction Noted  . Codeine Itching 08/27/2014  . Morphine and related Nausea Only 08/27/2014  . Other Other (See Comments)  08/27/2014    Family History  Problem Relation Age of Onset  . Hypertension Father     Social History   Social History  . Marital status: Married    Spouse name: N/A  . Number of children: N/A  . Years of education: N/A   Occupational History  . Not on file.   Social History Main Topics  . Smoking status: Current Some Day Smoker    Types: Cigarettes  . Smokeless tobacco: Never Used  . Alcohol use No  . Drug use: Unknown  . Sexual activity: Yes   Other Topics Concern  . Not on file   Social History Narrative  . No narrative on file    Review of Systems: All negative except as stated above in HPI.  Physical Exam: Vital signs: Vitals:   12/08/16 1606 12/08/16 1630  BP: (!) 176/102 (!) 178/98  Pulse: 93 94  Resp: 16 18  Temp:    T 98.1   General:  Lethargic, uncomfortable, mild acute distress, obese   Head: atraumatic Eyes: anicteric sclera ENT: oropharynx clear Neck: supple, nontender Lungs:  Clear throughout to auscultation.   No wheezes, crackles, or rhonchi. No acute distress. Heart:  Regular rate and rhythm; no murmurs, clicks, rubs,  or gallops. Abdomen: diffuse tenderness with guarding (greatest in epigastric and RUQ), protuberant, +BS, obese  Rectal:  Deferred Ext: no edema  GI:  Lab Results:  Recent Labs  12/08/16 1119  WBC 28.2*  HGB 13.7  HCT 38.0*  PLT 373   BMET  Recent Labs  12/08/16 1119  NA 139  K 2.6*  CL 96*  CO2 28  GLUCOSE 142*  BUN 35*  CREATININE 1.53*  CALCIUM 8.0*   LFT  Recent Labs  12/08/16 1119  PROT 7.8  ALBUMIN 4.4  AST 43*  ALT 41  ALKPHOS 69  BILITOT 0.9   PT/INR No results for input(s): LABPROT, INR in the last 72 hours.   Studies/Results: Ct Abdomen Pelvis W Contrast  Result Date: 12/08/2016 CLINICAL DATA:  Generalized abdominal Pain EXAM: CT ABDOMEN AND PELVIS WITH CONTRAST TECHNIQUE: Multidetector CT imaging of the abdomen and pelvis was performed using the standard protocol following  bolus administration of intravenous contrast. CONTRAST:  119mL ISOVUE-300 IOPAMIDOL (ISOVUE-300) INJECTION 61% COMPARISON:  10/23/2014 FINDINGS: Lower chest: No acute abnormality. Hepatobiliary: There is no focal liver abnormality. The gallbladder appears within normal limits. No biliary dilatation. Pancreas: There is diffuse edema and inflammation involving the pancreas compatible with acute pancreatitis. No evidence for pancreatic necrosis and no focal fluid collections identified at this time. Spleen: Normal in size without focal abnormality. Adrenals/Urinary Tract: The adrenal glands are normal. Unremarkable appearance of the left kidney. 8 mm exophytic lesion arising from the upper pole of right kidney is too small to characterize. No mass or hydronephrosis. Urinary bladder appears normal. Stomach/Bowel: The stomach is normal. The small bowel loops have a normal course and caliber. No obstruction. The appendix is visualized and is unremarkable. No pathologic dilatation of the  colon. Vascular/Lymphatic: Aortic atherosclerosis. No aneurysm. No upper abdominal adenopathy. No pelvic or inguinal adenopathy. Reproductive: Prostate is unremarkable. Other: There is edema/inflammation extending into the transverse mesocolon and small bowel mesentary. Free fluid extends into the retroperitoneal space and along the right pericolic gutter into the right iliac fossa. No well defined enhancing fluid collections identified at this time. Musculoskeletal: No aggressive lytic or sclerotic bone lesions. Status post anterior posterior fixation of L5-S1. IMPRESSION: 1. Imaging findings compatible with acute pancreatitis. There is marked edema and inflammation associated with the pancreas which extends into the transverse mesocolon, small bowel mesenterary and retroperitoneal space. 2. No well defined fluid collections identified to suggest pseudocyst. No pancreatic necrosis identified at this time. Electronically Signed   By:  Kerby Moors M.D.   On: 12/08/2016 13:50    Impression/Plan: 51 yo with acute pancreatitis of unclear etiology. Gallbladder normal and denies alcohol. May be medicine-related. Supportive care. Follow electrolytes. Aggressive IV hydration. May need an EUS as an outpt after resolution of the pancreatitis. Will follow.    LOS: 0 days   Garrison C.  12/08/2016, 4:54 PM  Pager [phone]  AFTER 5 pm or on weekends please call [phone]

## 2016-12-08 NOTE — ED Notes (Signed)
Pt asks if we want urine for a 'UDS'. He says he's 'not on drugs so why would we want a UDS'? Clarified to pt that this was for a urinalysis.

## 2016-12-08 NOTE — H&P (Signed)
History and Physical    Trevor Sullivan LKG:[id number] DOB: Jan 02, 1966 DOA: 12/08/2016  PCP: Derrill Center, MD  Patient coming from:   I have personally briefly reviewed patient's old medical records in [place]  Chief Complaint: Severe abdominal Pain.   HPI: Trevor Sullivan is a 51 y.o. male with medical history significant of  Chronic Low back pain, COPD, HTN who presents complaining of severe abdominal pain that started the night prior to admission. He report pain as sharp, stabbing, mid abdomen, generalized, constant, accompanied by nausea and vomiting. He denies alcohol use. No prior history of pancreatitis. His last BM was last night but very small, no real BM in 5 days.  On my evaluation patient with severe pain, anxiety, vomiting.   ED Course: Presents with abdominal pain, k 2.6, cr 1.5, Lipase 1261, WBC 28, CT abdomen and pelvis: Imaging findings compatible with acute pancreatitis. There is marked edema and inflammation associated with the pancreas which extends into the transverse mesocolon, small bowel mesenterary and retroperitoneal space.  Review of Systems: As per HPI otherwise 10 point review of systems negative.    Past Medical History:  Diagnosis Date  . Chronic low back pain   . COPD (chronic obstructive pulmonary disease) ([place])   . Hypertension   . PNA (pneumonia)     Past Surgical History:  Procedure Laterality Date  . BACK SURGERY       reports that he has been smoking Cigarettes.  He has never used smokeless tobacco. He reports that he does not drink alcohol. His drug history is not on file.  Allergies  Allergen Reactions  . Codeine Itching  . Morphine And Related Nausea Only  . Other Other (See Comments)    SSRI's and snri's-- starts shaking.     Family History  Problem Relation Age of Onset  . Hypertension Father    Brother history of pancreatitis,.   Prior to Admission medications   Medication Sig Start Date End Date Taking?  Authorizing Provider  amLODipine-benazepril (LOTREL) 10-40 MG per capsule Take 1 capsule by mouth daily.   Yes Historical Provider, MD  aspirin EC 81 MG tablet Take 324 mg by mouth once.   Yes Historical Provider, MD  bisoprolol (ZEBETA) 10 MG tablet Take 20 mg by mouth daily.   Yes Historical Provider, MD  budesonide-formoterol (SYMBICORT) 160-4.5 MCG/ACT inhaler Inhale 2 puffs into the lungs daily.   Yes Historical Provider, MD  cloNIDine (CATAPRES) 0.3 MG tablet Take 0.3 mg by mouth 3 (three) times daily.    Yes Historical Provider, MD  clotrimazole (MYCELEX) 10 MG troche Take 10 mg by mouth 3 (three) times daily. 12/07/16  Yes Historical Provider, MD  fentaNYL (DURAGESIC - DOSED MCG/HR) 25 MCG/HR patch Place 25 mcg onto the skin every 3 (three) days.   Yes Historical Provider, MD  gabapentin (NEURONTIN) 600 MG tablet Take 1,200 mg by mouth 4 (four) times daily.   Yes Historical Provider, MD  ibuprofen (ADVIL,MOTRIN) 200 MG tablet Take 400 mg by mouth every 6 (six) hours as needed for headache.   Yes Historical Provider, MD  ipratropium-albuterol (DUONEB) 0.5-2.5 (3) MG/3ML SOLN Take 3 mLs by nebulization every 4 (four) hours as needed. Patient taking differently: Take 3 mLs by nebulization every 6 (six) hours as needed (Short of breath).  08/27/14  Yes Benjamin Cartner, PA-C  methocarbamol (ROBAXIN) 750 MG tablet Take 750 mg by mouth 4 (four) times daily. 11/12/16  Yes Historical Provider, MD  ondansetron (ZOFRAN ODT) 4  MG disintegrating tablet 70m ODT q4 hours prn nausea/vomit 08/27/14  Yes BComer Locket PA-C  oxycodone (ROXICODONE) 30 MG immediate release tablet Take 30 mg by mouth 3 (three) times daily.   Yes Historical Provider, MD  pantoprazole (PROTONIX) 40 MG tablet Take 40 mg by mouth daily.   Yes Historical Provider, MD  PROAIR HFA 108 (90 BASE) MCG/ACT inhaler Inhale 2 puffs into the lungs every 6 (six) hours as needed for wheezing.  07/20/15  Yes Historical Provider, MD  tiotropium  (SPIRIVA) 18 MCG inhalation capsule Place 18 mcg into inhaler and inhale daily.   Yes Historical Provider, MD  predniSONE (STERAPRED UNI-PAK) 10 MG tablet Take 6-5-4-3-2-1 tablets by mouth daily till gone. Patient not taking: Reported on 12/08/2016 10/25/14   EKelvin Cellar MD    Physical Exam: Vitals:   12/08/16 1430 12/08/16 1508 12/08/16 1530 12/08/16 1606  BP: (!) 179/98 (!) 200/94 (!) 171/117 (!) 176/102  Pulse: 99 99  93  Resp: (!) 24 16 (!) 21 16  Temp:      TempSrc:      SpO2: 95% 95%  97%    Constitutional: Distress, due to Severe pain, anxious, hyperventilating.  Vitals:   12/08/16 1430 12/08/16 1508 12/08/16 1530 12/08/16 1606  BP: (!) 179/98 (!) 200/94 (!) 171/117 (!) 176/102  Pulse: 99 99  93  Resp: (!) 24 16 (!) 21 16  Temp:      TempSrc:      SpO2: 95% 95%  97%   Eyes: PERRL, lids and conjunctivae normal ENMT: Mucous membranes are moist. Posterior pharynx clear of any exudate or lesions.Normal dentition.  Neck: normal, supple, no masses, no thyromegaly Respiratory: clear to auscultation bilaterally, no wheezing, no crackles. Normal respiratory effort. No accessory muscle use.  Cardiovascular: Regular rate and rhythm, no murmurs / rubs / gallops. No extremity edema. 2+ pedal pulses. No carotid bruits.  Abdomen: Distended, no rigidity, no guarding, generalized tenderness, decreased Bowel sounds.  Musculoskeletal: no clubbing / cyanosis. No joint deformity upper and lower extremities. Good ROM, no contractures. Normal muscle tone.  Skin: no rashes, lesions, ulcers. No induration Neurologic: CN 2-12 grossly intact. Sensation intact, DTR normal. Strength 5/5 in all 4.  Psychiatric: Anxious.    Labs on Admission: I have personally reviewed following labs and imaging studies  CBC:  Recent Labs Lab 12/08/16 1119  WBC 28.2*  NEUTROABS 25.6*  HGB 13.7  HCT 38.0*  MCV 93.1  PLT 3160  Basic Metabolic Panel:  Recent Labs Lab 12/08/16 1119  NA 139  K 2.6*    CL 96*  CO2 28  GLUCOSE 142*  BUN 35*  CREATININE 1.53*  CALCIUM 8.0*   GFR: CrCl cannot be calculated (Unknown ideal weight.). Liver Function Tests:  Recent Labs Lab 12/08/16 1119  AST 43*  ALT 41  ALKPHOS 69  BILITOT 0.9  PROT 7.8  ALBUMIN 4.4    Recent Labs Lab 12/08/16 1119  LIPASE 1,261*   No results for input(s): AMMONIA in the last 168 hours. Coagulation Profile: No results for input(s): INR, PROTIME in the last 168 hours. Cardiac Enzymes: No results for input(s): CKTOTAL, CKMB, CKMBINDEX, TROPONINI in the last 168 hours. BNP (last 3 results) No results for input(s): PROBNP in the last 8760 hours. HbA1C: No results for input(s): HGBA1C in the last 72 hours. CBG: No results for input(s): GLUCAP in the last 168 hours. Lipid Profile: No results for input(s): CHOL, HDL, LDLCALC, TRIG, CHOLHDL, LDLDIRECT in the last 72 hours.  Thyroid Function Tests: No results for input(s): TSH, T4TOTAL, FREET4, T3FREE, THYROIDAB in the last 72 hours. Anemia Panel: No results for input(s): VITAMINB12, FOLATE, FERRITIN, TIBC, IRON, RETICCTPCT in the last 72 hours. Urine analysis:    Component Value Date/Time   COLORURINE YELLOW 12/08/2016 1410   APPEARANCEUR CLEAR 12/08/2016 1410   LABSPEC 1.032 (H) 12/08/2016 1410   PHURINE 5.0 12/08/2016 1410   GLUCOSEU NEGATIVE 12/08/2016 1410   HGBUR MODERATE (A) 12/08/2016 1410   BILIRUBINUR NEGATIVE 12/08/2016 1410   KETONESUR NEGATIVE 12/08/2016 1410   PROTEINUR 30 (A) 12/08/2016 1410   UROBILINOGEN 1.0 10/23/2014 1742   NITRITE NEGATIVE 12/08/2016 1410   LEUKOCYTESUR NEGATIVE 12/08/2016 1410    Radiological Exams on Admission: Ct Abdomen Pelvis W Contrast  Result Date: 12/08/2016 CLINICAL DATA:  Generalized abdominal Pain EXAM: CT ABDOMEN AND PELVIS WITH CONTRAST TECHNIQUE: Multidetector CT imaging of the abdomen and pelvis was performed using the standard protocol following bolus administration of intravenous contrast.  CONTRAST:  176m ISOVUE-300 IOPAMIDOL (ISOVUE-300) INJECTION 61% COMPARISON:  10/23/2014 FINDINGS: Lower chest: No acute abnormality. Hepatobiliary: There is no focal liver abnormality. The gallbladder appears within normal limits. No biliary dilatation. Pancreas: There is diffuse edema and inflammation involving the pancreas compatible with acute pancreatitis. No evidence for pancreatic necrosis and no focal fluid collections identified at this time. Spleen: Normal in size without focal abnormality. Adrenals/Urinary Tract: The adrenal glands are normal. Unremarkable appearance of the left kidney. 8 mm exophytic lesion arising from the upper pole of right kidney is too small to characterize. No mass or hydronephrosis. Urinary bladder appears normal. Stomach/Bowel: The stomach is normal. The small bowel loops have a normal course and caliber. No obstruction. The appendix is visualized and is unremarkable. No pathologic dilatation of the colon. Vascular/Lymphatic: Aortic atherosclerosis. No aneurysm. No upper abdominal adenopathy. No pelvic or inguinal adenopathy. Reproductive: Prostate is unremarkable. Other: There is edema/inflammation extending into the transverse mesocolon and small bowel mesentary. Free fluid extends into the retroperitoneal space and along the right pericolic gutter into the right iliac fossa. No well defined enhancing fluid collections identified at this time. Musculoskeletal: No aggressive lytic or sclerotic bone lesions. Status post anterior posterior fixation of L5-S1. IMPRESSION: 1. Imaging findings compatible with acute pancreatitis. There is marked edema and inflammation associated with the pancreas which extends into the transverse mesocolon, small bowel mesenterary and retroperitoneal space. 2. No well defined fluid collections identified to suggest pseudocyst. No pancreatic necrosis identified at this time. Electronically Signed   By: TKerby MoorsM.D.   On: 12/08/2016 13:50     EKG: Independently reviewed. Sinus tachycardia on telemetry   Assessment/Plan Active Problems:   Abdominal pain   Essential hypertension   H/O spinal fusion   Acute pancreatitis   Pancreatitis   AKI ([place] injury) ([place]  1-Acute Pancreatitis, Severe;  Presents with abdominal pain, nausea, vomiting, CT with pancreatic inflammation, There is marked edema and inflammation associated with the pancreas which extends into the transverse mesocolon, small bowel mesenterary and retroperitoneal space. Leukocytosis.  Admit to step down unit.  IV fluids at 150 cc/hr. 2 L IV fluids ordered.  IV Protonix.  Check triglycerides.  IV dilaudid 2-3 mg IV Q 3 hours.  Will start IV antibiotics to cover for infection due to leukocytosis.   2-Hypokalemia; Patient to received 10 meq times 3 runs.  Repeat B-met.   Check Mg level.   3-AKI;  IV fluids. Avoid nephrotoxin.  Follow labs.   4-COPD; Continue with nebulizer.  5-HTN;  Takes clonidine at home, will order clonidine patch.  Hydralazine PRN  6-Chronic pain;  Continue with Fentanyl patch.   7-Leukocytosis;  Check blood cultures.  Check chest x ray.  IV antibiotics.    DVT prophylaxis:  Heparin.  Code Status: full code.  Family Communication: Wife who was at bedside.  Disposition Plan: Home at time of discharge Consults called: GI called by ED Admission status: Inpatient, step down.    Elmarie Shiley MD [facility] Pager [phone]  If 7PM-7AM, please contact night-coverage [email or web address] Password [email or web address]  12/08/2016, 4:21 PM

## 2016-12-08 NOTE — Progress Notes (Signed)
Pharmacy Antibiotic Note  Trevor Sullivan is a 51 y.o. male admitted on 12/08/2016 with severe acute pancreatitis.  Pharmacy has been consulted for Meropenem dosing.  Plan: Meropenem 1g IV q8h. Monitor renal function, cultures, clinical course.   Height: 5\' 3"  (160 cm) Weight: 225 lb (102.1 kg) IBW/kg (Calculated) : 56.9  Temp (24hrs), Avg:98.1 F (36.7 C), Min:98.1 F (36.7 C), Max:98.1 F (36.7 C)   Recent Labs Lab 12/08/16 1119  WBC 28.2*  CREATININE 1.53*    Estimated Creatinine Clearance: 62.6 mL/min (A) (by C-G formula based on SCr of 1.53 mg/dL (H)).    Allergies  Allergen Reactions  . Codeine Itching  . Morphine And Related Nausea Only  . Other Other (See Comments)    SSRI's and snri's-- starts shaking.     Antimicrobials this admission: 4/16 >> Meropenem >>  Dose adjustments this admission: --  Microbiology results: 4/16 BCx: sent   Thank you for allowing pharmacy to be a part of this patient's care.  Lindell Spar, PharmD, BCPS Pager: [phone] 12/08/2016 4:47 PM

## 2016-12-09 DIAGNOSIS — R1084 Generalized abdominal pain: Secondary | ICD-10-CM

## 2016-12-09 LAB — COMPREHENSIVE METABOLIC PANEL
ALT: 29 U/L (ref 17–63)
AST: 29 U/L (ref 15–41)
Albumin: 3.5 g/dL (ref 3.5–5.0)
Alkaline Phosphatase: 50 U/L (ref 38–126)
Anion gap: 9 (ref 5–15)
BILIRUBIN TOTAL: 0.7 mg/dL (ref 0.3–1.2)
BUN: 22 mg/dL — AB (ref 6–20)
CHLORIDE: 101 mmol/L (ref 101–111)
CO2: 28 mmol/L (ref 22–32)
Calcium: 6.9 mg/dL — ABNORMAL LOW (ref 8.9–10.3)
Creatinine, Ser: 0.83 mg/dL (ref 0.61–1.24)
GFR calc Af Amer: >60 mL/min (ref >60)
GFR calc non Af Amer: >60 mL/min (ref >60)
GLUCOSE: 123 mg/dL — AB (ref 65–99)
POTASSIUM: 2.8 mmol/L — AB (ref 3.5–5.1)
Sodium: 138 mmol/L (ref 135–145)
TOTAL PROTEIN: 6.2 g/dL — AB (ref 6.5–8.1)

## 2016-12-09 LAB — CBC
HCT: 37.7 % — ABNORMAL LOW (ref 39.0–52.0)
HEMOGLOBIN: 13.1 g/dL (ref 13.0–17.0)
MCH: 33.6 pg (ref 26.0–34.0)
MCHC: 34.7 g/dL (ref 30.0–36.0)
MCV: 96.7 fL (ref 78.0–100.0)
Platelets: 299 10*3/uL (ref 150–400)
RBC: 3.9 MIL/uL — ABNORMAL LOW (ref 4.22–5.81)
RDW: 13.9 % (ref 11.5–15.5)
WBC: 26 10*3/uL — ABNORMAL HIGH (ref 4.0–10.5)

## 2016-12-09 LAB — LIPASE, BLOOD: Lipase: 325 U/L — ABNORMAL HIGH (ref 11–51)

## 2016-12-09 LAB — MAGNESIUM: Magnesium: 2.1 mg/dL (ref 1.7–2.4)

## 2016-12-09 LAB — HIV ANTIBODY (ROUTINE TESTING W REFLEX): HIV SCREEN 4TH GENERATION: NONREACTIVE

## 2016-12-09 MED ORDER — BENAZEPRIL HCL 40 MG PO TABS
40.0000 mg | ORAL_TABLET | Freq: Every day | ORAL | Status: DC
  Filled 2016-12-09: qty 1

## 2016-12-09 MED ORDER — KCL IN DEXTROSE-NACL 40-5-0.9 MEQ/L-%-% IV SOLN
INTRAVENOUS | Status: DC
  Administered 2016-12-09: 16:00:00 via INTRAVENOUS
  Administered 2016-12-09 – 2016-12-10 (×2): 125 mL/h via INTRAVENOUS
  Administered 2016-12-10 – 2016-12-11 (×4): via INTRAVENOUS
  Filled 2016-12-09 (×12): qty 1000

## 2016-12-09 MED ORDER — MORPHINE SULFATE 2 MG/ML IV SOLN
INTRAVENOUS | Status: DC
Start: 2016-12-09 — End: 2016-12-10
  Administered 2016-12-09: 20 mg via INTRAVENOUS
  Administered 2016-12-09: 21 mg via INTRAVENOUS
  Administered 2016-12-09 (×2): via INTRAVENOUS
  Administered 2016-12-10: 9 mg via INTRAVENOUS
  Administered 2016-12-10: 6 mg via INTRAVENOUS
  Administered 2016-12-10: 5 mg via INTRAVENOUS
  Administered 2016-12-10: 15 mg via INTRAVENOUS
  Administered 2016-12-10: 5 mg via INTRAVENOUS
  Filled 2016-12-09 (×2): qty 30

## 2016-12-09 MED ORDER — SODIUM CHLORIDE 0.9 % IV SOLN
30.0000 meq | Freq: Once | INTRAVENOUS | Status: DC
  Filled 2016-12-09: qty 15

## 2016-12-09 MED ORDER — HYDRALAZINE HCL 20 MG/ML IJ SOLN
10.0000 mg | Freq: Once | INTRAMUSCULAR | Status: AC
  Administered 2016-12-09: 10 mg via INTRAVENOUS
  Filled 2016-12-09: qty 1

## 2016-12-09 MED ORDER — DIPHENHYDRAMINE HCL 50 MG/ML IJ SOLN: 12.5000 mg | Freq: Four times a day (QID) | INTRAMUSCULAR | Status: DC | PRN

## 2016-12-09 MED ORDER — SODIUM CHLORIDE 0.9% FLUSH: 9.0000 mL | INTRAVENOUS | Status: DC | PRN

## 2016-12-09 MED ORDER — MORPHINE SULFATE 2 MG/ML IV SOLN
INTRAVENOUS | Status: DC
Start: 2016-12-09 — End: 2016-12-09

## 2016-12-09 MED ORDER — CLONIDINE HCL 0.3 MG/24HR TD PTWK
0.3000 mg | MEDICATED_PATCH | TRANSDERMAL | Status: DC
  Administered 2016-12-09: 0.3 mg via TRANSDERMAL
  Filled 2016-12-09 (×2): qty 1

## 2016-12-09 MED ORDER — MORPHINE SULFATE 2 MG/ML IV SOLN: INTRAVENOUS | Status: DC

## 2016-12-09 MED ORDER — HYDRALAZINE HCL 20 MG/ML IJ SOLN
10.0000 mg | INTRAMUSCULAR | Status: DC | PRN
  Administered 2016-12-09 – 2016-12-10 (×2): 10 mg via INTRAVENOUS
  Filled 2016-12-09 (×2): qty 1

## 2016-12-09 MED ORDER — DEXTROSE 5 % IV SOLN
0.5000 mg/min | INTRAVENOUS | Status: DC
  Administered 2016-12-09: 3 mg/min via INTRAVENOUS
  Administered 2016-12-09: 1 mg/min via INTRAVENOUS
  Administered 2016-12-09: 0.5 mg/min via INTRAVENOUS
  Filled 2016-12-09: qty 80
  Filled 2016-12-09 (×2): qty 100

## 2016-12-09 MED ORDER — ONDANSETRON HCL 4 MG/2ML IJ SOLN: 4.0000 mg | Freq: Four times a day (QID) | INTRAMUSCULAR | Status: DC | PRN

## 2016-12-09 MED ORDER — NALOXONE HCL 0.4 MG/ML IJ SOLN: 0.4000 mg | INTRAMUSCULAR | Status: DC | PRN

## 2016-12-09 MED ORDER — DIPHENHYDRAMINE HCL 12.5 MG/5ML PO ELIX: 12.5000 mg | ORAL_SOLUTION | Freq: Four times a day (QID) | ORAL | Status: DC | PRN

## 2016-12-09 MED ORDER — CLONIDINE HCL 0.3 MG/24HR TD PTWK: 0.3000 mg | MEDICATED_PATCH | TRANSDERMAL | Status: DC

## 2016-12-09 MED ORDER — AMLODIPINE BESYLATE 10 MG PO TABS: 10.0000 mg | ORAL_TABLET | Freq: Every day | ORAL | Status: DC

## 2016-12-09 MED ORDER — ORAL CARE MOUTH RINSE
15.0000 mL | Freq: Two times a day (BID) | OROMUCOSAL | Status: DC
  Administered 2016-12-09 – 2016-12-12 (×2): 15 mL via OROMUCOSAL

## 2016-12-09 MED ORDER — AMLODIPINE BESY-BENAZEPRIL HCL 10-40 MG PO CAPS: 1.0000 | ORAL_CAPSULE | Freq: Every day | ORAL | Status: DC

## 2016-12-09 MED ORDER — LORAZEPAM 2 MG/ML IJ SOLN
0.5000 mg | INTRAMUSCULAR | Status: DC | PRN
  Administered 2016-12-09 – 2016-12-10 (×3): 0.5 mg via INTRAVENOUS
  Filled 2016-12-09 (×3): qty 1

## 2016-12-09 MED ORDER — ONDANSETRON HCL 4 MG/2ML IJ SOLN
4.0000 mg | Freq: Four times a day (QID) | INTRAMUSCULAR | Status: DC | PRN
  Administered 2016-12-09 (×2): 4 mg via INTRAVENOUS
  Filled 2016-12-09: qty 2

## 2016-12-09 NOTE — Care Management Note (Signed)
Case Management Note  Patient Details  Name: Trevor Sullivan MRN: [id number] Date of Birth: 06-07-1966  Subjective/Objective:            Nausea and vomiting elevated wbc and low K+        Action/Plan:Date:  December 09, 2016 Chart reviewed for concurrent status and case management needs. Will continue to follow patient progress. Discharge Planning: following for needs Expected discharge date: [id number] Velva Harman, BSN, [place], [place]  Expected Discharge Date:   (unknown)               Expected Discharge Plan:  Home/Self Care  In-House Referral:     Discharge planning Services     Post Acute Care Choice:    Choice offered to:     DME Arranged:    DME Agency:     HH Arranged:    [place] Agency:     Status of Service:  In process, will continue to follow  If discussed at Long Length of Stay Meetings, dates discussed:    Additional Comments:  Leeroy Cha, RN 12/09/2016, 9:31 AM

## 2016-12-09 NOTE — Progress Notes (Signed)
Lab at bedside drawing blood. Patient moaning and crying in pain. PRN pain meds given

## 2016-12-09 NOTE — Progress Notes (Signed)
Patient crying out and moaning states having pain all across abdomen. I helped him reposition and attempt to find position of comfort. I explained the medications the doctors were giving him for pain and inflammation. I wrote time intervals on the board when patient could receive pain and anxiety medications patient stated he understood.

## 2016-12-09 NOTE — Progress Notes (Signed)
Patient sleeping but hypertensive. Contacted hospitalists

## 2016-12-09 NOTE — Progress Notes (Addendum)
PROGRESS NOTE    Trevor Sullivan  KCL:[id number] DOB: 28-Oct-1965 DOA: 12/08/2016 PCP: Derrill Center, MD   Subjective: Patient reports severe abdominal pain and back pain. Denies fever/chills, nausea, vomiting. Also has difficulty sleeping due to pain  Brief Narrative: Trevor Sullivan is a 51 y.o. male with medical history significant of chronic low back pain on Fentanyl 25 mcg and Oxycodone 30 mg at home, COPD, HTN who presented to the emergency department on 4/16 complaining of severe abdominal pain that started the night prior to admission. He reported pain as sharp, stabbing, mid abdomen, generalized, constant, accompanied by nausea and vomiting. He denies alcohol use. No prior history of pancreatitis. His last BM was 4/15 but very small, no real BM in 5 days. He had WBC 29, lipase 1261, Cr 1.5, K 2.6 and CT abdomen compatible with acute pancreatitis and no gall bladder involvement. He is being treated with meropenem, aggressive IV fluids, and pain control. GI saw patient on 4/16 and 4/17 and recommends continued supportive care. Patient may need EUS as outpatient after pancreatitis resolves.  Assessment & Plan:   Active Problems:   Abdominal pain   Essential hypertension   H/O spinal fusion   Acute pancreatitis   Pancreatitis   AKI (acute kidney injury) ([place])   Acute Pancreatitis, Severe  - On 4/16, presents with abdominal pain, nausea, vomiting, leukocytosis 29 CT shows pancreatic inflammation. There is marked edema and inflammation associated with the pancreas which extends into the transverse mesocolon, small bowel mesenterary and retroperitoneal space, no hemorrhage or necrosis. - No gallstones per CT scans, denies alcohol drinking, this is likely idiopathic versus medications. - Triglyceride 485. Lipase decreasing 1261 >> 325 today. AST 43 >> 29 - Continue IV fluids of D5W, NS, and KCl 40 mEq/L infusion.  - Continue IV Protonix, antiemetics. - Discontinue IV dilaudid due to  persistent pain and begin morphine 2 mg/mL PCA for better pain management - Continue IV Meropenem - Remain NPO  Hypertensive urgency - Continue Clonidine patch that patient took pre-admission - Hydralazine PRN. Blood pressure remains elevated most likely secondary to patient aggression and pain. -Blood pressure is not controlled with as needed hydralazine/clonidine patch, started on labetalol drip. -Blood pressure was up to 238/118.  Hypokalemia - K 2.8. Replenish with KCl 40 mEq/L - Magnesium WNL - Continue to monitor BMP  AKI - On admission, Cr 1.5, BUN 35  - Marked improvement to Cr. 0.83, BUN 22 after IV fluids.  COPD - Continue with nebulizer, Dulera, Duoneb. Discontinue Spiriva  - 97% O2 sat on 2L nasal cannula  Chronic pain;  - Morphine 2 mg/mL PCA for abdominal and back pain  Anxiety - Ativan prn  Leukocytosis - WBC trending down 28.2 >> 26 - Blood cultures pending, negative CXR - Continue IV Merepenem   DVT prophylaxis: Heparin Code Status: Full Family Communication: None at bedside Disposition Plan: Home when stable  Consultants:   GI  Procedures:   None  Antimicrobials:  IV Meropenem 4/16 >>  Objective: Vitals:   12/09/16 0800 12/09/16 0900 12/09/16 0912 12/09/16 0925  BP: (!) 213/109 (!) 195/90    Pulse: (!) 104 (!) 109    Resp: 18 14 19    Temp:      TempSrc:      SpO2: 95% 100% 97% 97%  Weight:      Height:        Intake/Output Summary (Last 24 hours) at 12/09/16 1005 Last data filed at 12/09/16 0800  Gross per 24  hour  Intake          1552.08 ml  Output             1425 ml  Net           127.08 ml   Filed Weights   12/08/16 1629 12/08/16 2052  Weight: 102.1 kg (225 lb) 107.1 kg (236 lb 1.8 oz)    Examination:  General exam: Appears in moderate distress and uncomfortable, writhing in pain on the bed Respiratory system: Clear to auscultation. Respiratory effort normal. Cardiovascular system: S1 & S2 heard, RRR. No JVD,  murmurs, rubs, gallops or clicks. No pedal edema. Gastrointestinal system: Abdomen has diffuse tenderness with guarding over epigastric, RUQ, LUQ. Protuberant. No organomegaly or masses felt. Normal bowel sounds heard. Central nervous system: Alert and oriented. No focal neurological deficits. Extremities: Symmetric 5 x 5 power. Skin: No rashes, lesions or ulcers Psychiatry: Judgement and insight appear normal. Mood & affect appropriate.   Data Reviewed: I have personally reviewed following labs and imaging studies  CBC:  Recent Labs Lab 12/08/16 1119 12/09/16 0334  WBC 28.2* 26.0*  NEUTROABS 25.6*  --   HGB 13.7 13.1  HCT 38.0* 37.7*  MCV 93.1 96.7  PLT 373 718   Basic Metabolic Panel:  Recent Labs Lab 12/08/16 1119 12/08/16 1755 12/08/16 2150 12/09/16 0334  NA 139 138  --  138  K 2.6* 2.8*  --  2.8*  CL 96* 100*  --  101  CO2 28 27  --  28  GLUCOSE 142* 118*  --  123*  BUN 35* 28*  --  22*  CREATININE 1.53* 1.20 1.02 0.83  CALCIUM 8.0* 6.9*  --  6.9*  MG  --  1.7  --  2.1   GFR: Estimated Creatinine Clearance: 122.7 mL/min (by C-G formula based on SCr of 0.83 mg/dL). Liver Function Tests:  Recent Labs Lab 12/08/16 1119 12/09/16 0334  AST 43* 29  ALT 41 29  ALKPHOS 69 50  BILITOT 0.9 0.7  PROT 7.8 6.2*  ALBUMIN 4.4 3.5    Recent Labs Lab 12/08/16 1119 12/09/16 0334  LIPASE 1,261* 325*   Lipid Profile:  Recent Labs  12/08/16 1125  TRIG 485*    Recent Results (from the past 240 hour(s))  MRSA PCR Screening     Status: None   Collection Time: 12/08/16  9:01 PM  Result Value Ref Range Status   MRSA by PCR NEGATIVE NEGATIVE Final    Comment:        The GeneXpert MRSA Assay (FDA approved for NASAL specimens only), is one component of a comprehensive MRSA colonization surveillance program. It is not intended to diagnose MRSA infection nor to guide or monitor treatment for MRSA infections.          Radiology Studies: Ct Abdomen  Pelvis W Contrast  Result Date: 12/08/2016 CLINICAL DATA:  Generalized abdominal Pain EXAM: CT ABDOMEN AND PELVIS WITH CONTRAST TECHNIQUE: Multidetector CT imaging of the abdomen and pelvis was performed using the standard protocol following bolus administration of intravenous contrast. CONTRAST:  130mL ISOVUE-300 IOPAMIDOL (ISOVUE-300) INJECTION 61% COMPARISON:  10/23/2014 FINDINGS: Lower chest: No acute abnormality. Hepatobiliary: There is no focal liver abnormality. The gallbladder appears within normal limits. No biliary dilatation. Pancreas: There is diffuse edema and inflammation involving the pancreas compatible with acute pancreatitis. No evidence for pancreatic necrosis and no focal fluid collections identified at this time. Spleen: Normal in size without focal abnormality. Adrenals/Urinary Tract: The adrenal glands  are normal. Unremarkable appearance of the left kidney. 8 mm exophytic lesion arising from the upper pole of right kidney is too small to characterize. No mass or hydronephrosis. Urinary bladder appears normal. Stomach/Bowel: The stomach is normal. The small bowel loops have a normal course and caliber. No obstruction. The appendix is visualized and is unremarkable. No pathologic dilatation of the colon. Vascular/Lymphatic: Aortic atherosclerosis. No aneurysm. No upper abdominal adenopathy. No pelvic or inguinal adenopathy. Reproductive: Prostate is unremarkable. Other: There is edema/inflammation extending into the transverse mesocolon and small bowel mesentary. Free fluid extends into the retroperitoneal space and along the right pericolic gutter into the right iliac fossa. No well defined enhancing fluid collections identified at this time. Musculoskeletal: No aggressive lytic or sclerotic bone lesions. Status post anterior posterior fixation of L5-S1. IMPRESSION: 1. Imaging findings compatible with acute pancreatitis. There is marked edema and inflammation associated with the pancreas  which extends into the transverse mesocolon, small bowel mesenterary and retroperitoneal space. 2. No well defined fluid collections identified to suggest pseudocyst. No pancreatic necrosis identified at this time. Electronically Signed   By: Kerby Moors M.D.   On: 12/08/2016 13:50   Dg Chest Port 1 View  Result Date: 12/08/2016 CLINICAL DATA:  Leukocytosis.  COPD EXAM: PORTABLE CHEST 1 VIEW COMPARISON:  10/08/2015 FINDINGS: Heart size is accentuated by low volumes. No suspected cardiomegaly. There is no edema, consolidation, effusion, or pneumothorax. IMPRESSION: Low volume chest without pneumonia. Electronically Signed   By: Monte Fantasia M.D.   On: 12/08/2016 17:01   Scheduled Meds: . cloNIDine  0.3 mg Transdermal Weekly  . heparin  5,000 Units Subcutaneous Q8H  . mometasone-formoterol  2 puff Inhalation BID  . morphine   Intravenous Q4H  . pantoprazole (PROTONIX) IV  40 mg Intravenous Q12H  . tiotropium  18 mcg Inhalation Daily   Continuous Infusions: . dextrose 5 % and 0.9 % NaCl with KCl 40 mEq/L 125 mL/hr at 12/09/16 0800  . meropenem (MERREM) IV       LOS: 1 day    Time spent: 30 minutes  Hajar Sakhi, PA-Student   If 7PM-7AM, please contact night-coverage [email or web address] Password [email or web address] 12/09/2016, 10:05 AM   Birdie Hopes Pager: [phone] 12/09/2016, 12:37 PM

## 2016-12-09 NOTE — Progress Notes (Signed)
Patient slept for approximately hour and a half then IV pump beeped and patient woke up and started screaming of pain in his abdomen I went over schedule for medication and helped him reposition for comfort. Patient continued to moan while I was in room however when I left the room he went back to sleep.

## 2016-12-09 NOTE — Progress Notes (Signed)
Disruptive behavior continues - pt continues to yell loudly for morphine and ativan.

## 2016-12-09 NOTE — Progress Notes (Signed)
Patient asleep and snoring took O2 off and sats were 86. I placed oxygen back on without waking patient and sats improved to 98. Patient appears comfortable.

## 2016-12-09 NOTE — Progress Notes (Signed)
[facility] Progress Note  Trevor Sullivan 51 y.o. [birth year]   Subjective: Complaining of abdominal pain and anxiety.  Objective: Vital signs in last 24 hours: Vitals:   12/09/16 0900 12/09/16 0912  BP: (!) 195/90   Pulse: (!) 109   Resp: 14 19  Temp:    T 99.2  Physical Exam: Gen: +acute distress, lethargic, obese CV: RRR Chest: CTA B Abd: diffuse tenderness with guarding to light palpation, protuberant, +BS Ext: no edema  Lab Results:  Recent Labs  12/08/16 1755 12/08/16 2150 12/09/16 0334  NA 138  --  138  K 2.8*  --  2.8*  CL 100*  --  101  CO2 27  --  28  GLUCOSE 118*  --  123*  BUN 28*  --  22*  CREATININE 1.20 1.02 0.83  CALCIUM 6.9*  --  6.9*  MG 1.7  --  2.1    Recent Labs  12/08/16 1119 12/09/16 0334  AST 43* 29  ALT 41 29  ALKPHOS 69 50  BILITOT 0.9 0.7  PROT 7.8 6.2*  ALBUMIN 4.4 3.5    Recent Labs  12/08/16 1119 12/09/16 0334  WBC 28.2* 26.0*  NEUTROABS 25.6*  --   HGB 13.7 13.1  HCT 38.0* 37.7*  MCV 93.1 96.7  PLT 373 299   No results for input(s): LABPROT, INR in the last 72 hours.    Assessment/Plan: Acute pancreatitis - continue aggressive hydration. Supportive care. Bowel rest (ice chips ok). Correct electrolytes (per primary team).   [place]. 12/09/2016, 9:35 AM   Pager [phone]  AFTER 5 pm or on weekends call [phone] ID: Trevor Sullivan, male   DOB: 1966/07/13, 51 y.o.   MRN: [id number]

## 2016-12-09 NOTE — Progress Notes (Signed)
In room hanging multiple IV medications and Patient asleep snoring O2 sats continue at 97. Approximately 5 minutes after being in room patient began screaming, moaning and calling out for pain medication. I gave pain medication and patient had relief.

## 2016-12-09 NOTE — Progress Notes (Signed)
This RN responded to IV alarm. Upon walking into room, pt had IV bag of D5 1/2 NS w/40K in his hand and was squirting fluid into his mouth. He asked "is this morphine?". RN instructed pt he was not allowed to touch his IV fluids and it could be dangerous to drink them. Morphine PCA was untouched and locked. RN notified charge RN and MD.

## 2016-12-09 NOTE — Progress Notes (Signed)
Pt yells non stop about not being able to eat. He is required to be NPO. He called his wife to bring some food. He also stated he is going to go AMA. He screams non stop for ativan, morphine, and constantly pulls out his cords and attempts to pull put his IV.

## 2016-12-09 NOTE — Progress Notes (Signed)
Bed alarm going off patient out of bed and taking all equipment off, reminded patient he could not get up now due to medications he had received. He was very unsteady and lost balance almost falling. Patient not cooperative but did get back into bed without injury. He stated he "thought he could go to bathroom because the light was on". I reminded him to stay in bed and verified the urinal was still at his reach beside the bed. Patient stated "You just don't know how much medication I take regularly. I can handle it". I assured him we were trying to keep him safe and he agreed to stay in bed. Bed alarm on.

## 2016-12-09 NOTE — Progress Notes (Signed)
Patient continues to be hypertensive, often worse as pain is worse. Hydrazine given from PRN orders

## 2016-12-09 NOTE — Progress Notes (Signed)
Patient asleep and snoring. O2 sats 95

## 2016-12-10 DIAGNOSIS — Z6839 Body mass index (BMI) 39.0-39.9, adult: Secondary | ICD-10-CM

## 2016-12-10 DIAGNOSIS — E876 Hypokalemia: Secondary | ICD-10-CM

## 2016-12-10 DIAGNOSIS — R451 Restlessness and agitation: Secondary | ICD-10-CM

## 2016-12-10 DIAGNOSIS — K8501 Idiopathic acute pancreatitis with uninfected necrosis: Secondary | ICD-10-CM

## 2016-12-10 DIAGNOSIS — E669 Obesity, unspecified: Secondary | ICD-10-CM

## 2016-12-10 DIAGNOSIS — E6609 Other obesity due to excess calories: Secondary | ICD-10-CM

## 2016-12-10 DIAGNOSIS — G894 Chronic pain syndrome: Secondary | ICD-10-CM

## 2016-12-10 DIAGNOSIS — G9340 Encephalopathy, unspecified: Secondary | ICD-10-CM

## 2016-12-10 DIAGNOSIS — K859 Acute pancreatitis without necrosis or infection, unspecified: Secondary | ICD-10-CM

## 2016-12-10 LAB — CBC
HEMATOCRIT: 32.5 % — AB (ref 39.0–52.0)
HEMOGLOBIN: 11.4 g/dL — AB (ref 13.0–17.0)
MCH: 34 pg (ref 26.0–34.0)
MCHC: 35.1 g/dL (ref 30.0–36.0)
MCV: 97 fL (ref 78.0–100.0)
Platelets: 194 10*3/uL (ref 150–400)
RBC: 3.35 MIL/uL — AB (ref 4.22–5.81)
RDW: 14.1 % (ref 11.5–15.5)
WBC: 17 10*3/uL — AB (ref 4.0–10.5)

## 2016-12-10 LAB — COMPREHENSIVE METABOLIC PANEL
ALBUMIN: 3 g/dL — AB (ref 3.5–5.0)
ALK PHOS: 44 U/L (ref 38–126)
ALT: 22 U/L (ref 17–63)
AST: 28 U/L (ref 15–41)
Anion gap: 7 (ref 5–15)
BILIRUBIN TOTAL: 0.9 mg/dL (ref 0.3–1.2)
BUN: 14 mg/dL (ref 6–20)
CALCIUM: 7.1 mg/dL — AB (ref 8.9–10.3)
CO2: 28 mmol/L (ref 22–32)
Chloride: 102 mmol/L (ref 101–111)
Creatinine, Ser: 0.61 mg/dL (ref 0.61–1.24)
GFR calc Af Amer: >60 mL/min (ref >60)
GFR calc non Af Amer: >60 mL/min (ref >60)
GLUCOSE: 120 mg/dL — AB (ref 65–99)
POTASSIUM: 3.2 mmol/L — AB (ref 3.5–5.1)
Sodium: 137 mmol/L (ref 135–145)
Total Protein: 5.7 g/dL — ABNORMAL LOW (ref 6.5–8.1)

## 2016-12-10 MED ORDER — HYDRALAZINE HCL 20 MG/ML IJ SOLN
10.0000 mg | INTRAMUSCULAR | Status: DC | PRN
  Administered 2016-12-10 – 2016-12-11 (×3): 10 mg via INTRAVENOUS
  Filled 2016-12-10 (×3): qty 1

## 2016-12-10 MED ORDER — DEXMEDETOMIDINE HCL IN NACL 400 MCG/100ML IV SOLN
0.4000 ug/kg/h | INTRAVENOUS | Status: AC
  Administered 2016-12-10: 0.5 ug/kg/h via INTRAVENOUS
  Administered 2016-12-11: 1.2 ug/kg/h via INTRAVENOUS
  Administered 2016-12-11 (×2): 1 ug/kg/h via INTRAVENOUS
  Administered 2016-12-11 (×2): 1.2 ug/kg/h via INTRAVENOUS
  Filled 2016-12-10 (×7): qty 100

## 2016-12-10 MED ORDER — DOCUSATE SODIUM 100 MG PO CAPS
100.0000 mg | ORAL_CAPSULE | Freq: Two times a day (BID) | ORAL | Status: DC
  Administered 2016-12-10 – 2016-12-12 (×4): 100 mg via ORAL
  Filled 2016-12-10 (×4): qty 1

## 2016-12-10 MED ORDER — SODIUM CHLORIDE 0.9 % IV SOLN: 30.0000 meq | Freq: Once | INTRAVENOUS | Status: DC

## 2016-12-10 MED ORDER — POTASSIUM CHLORIDE CRYS ER 20 MEQ PO TBCR: 40.0000 meq | EXTENDED_RELEASE_TABLET | ORAL | Status: DC

## 2016-12-10 MED ORDER — OLANZAPINE 10 MG PO TBDP
10.0000 mg | ORAL_TABLET | Freq: Every day | ORAL | Status: DC
  Administered 2016-12-10 – 2016-12-12 (×3): 10 mg via ORAL
  Filled 2016-12-10 (×3): qty 1

## 2016-12-10 MED ORDER — DIAZEPAM 5 MG/ML IJ SOLN
5.0000 mg | Freq: Four times a day (QID) | INTRAMUSCULAR | Status: DC
  Administered 2016-12-10 – 2016-12-11 (×3): 5 mg via INTRAVENOUS
  Filled 2016-12-10 (×3): qty 2

## 2016-12-10 MED ORDER — AMLODIPINE BESYLATE 10 MG PO TABS
10.0000 mg | ORAL_TABLET | Freq: Every day | ORAL | Status: DC
  Administered 2016-12-11 – 2016-12-12 (×2): 10 mg via ORAL
  Filled 2016-12-10 (×2): qty 1

## 2016-12-10 MED ORDER — SENNA 8.6 MG PO TABS: 1.0000 | ORAL_TABLET | Freq: Every day | ORAL | Status: DC | PRN

## 2016-12-10 MED ORDER — PANTOPRAZOLE SODIUM 40 MG PO TBEC
40.0000 mg | DELAYED_RELEASE_TABLET | Freq: Every day | ORAL | Status: DC
  Administered 2016-12-11: 40 mg via ORAL
  Filled 2016-12-10: qty 1

## 2016-12-10 MED ORDER — HALOPERIDOL LACTATE 5 MG/ML IJ SOLN
1.0000 mg | Freq: Two times a day (BID) | INTRAMUSCULAR | Status: DC | PRN
  Administered 2016-12-10 – 2016-12-11 (×2): 1 mg via INTRAVENOUS
  Filled 2016-12-10 (×2): qty 1

## 2016-12-10 MED ORDER — LORAZEPAM 2 MG/ML IJ SOLN
2.0000 mg | Freq: Once | INTRAMUSCULAR | Status: AC
  Administered 2016-12-10: 2 mg via INTRAVENOUS
  Filled 2016-12-10: qty 1

## 2016-12-10 MED ORDER — MORPHINE SULFATE (PF) 4 MG/ML IV SOLN
2.0000 mg | INTRAVENOUS | Status: DC | PRN
  Administered 2016-12-11 – 2016-12-12 (×7): 4 mg via INTRAVENOUS
  Filled 2016-12-10 (×7): qty 1

## 2016-12-10 MED ORDER — METHOCARBAMOL 500 MG PO TABS
750.0000 mg | ORAL_TABLET | Freq: Three times a day (TID) | ORAL | Status: DC
  Administered 2016-12-10 – 2016-12-12 (×6): 750 mg via ORAL
  Filled 2016-12-10 (×6): qty 2

## 2016-12-10 MED ORDER — LABETALOL HCL 200 MG PO TABS
200.0000 mg | ORAL_TABLET | Freq: Two times a day (BID) | ORAL | Status: DC
  Administered 2016-12-10 – 2016-12-11 (×4): 200 mg via ORAL
  Filled 2016-12-10 (×4): qty 1

## 2016-12-10 MED ORDER — HALOPERIDOL LACTATE 5 MG/ML IJ SOLN
1.0000 mg | Freq: Four times a day (QID) | INTRAMUSCULAR | Status: DC | PRN
  Administered 2016-12-10: 1 mg via INTRAVENOUS
  Filled 2016-12-10: qty 1

## 2016-12-10 MED ORDER — FENTANYL 25 MCG/HR TD PT72
25.0000 ug | MEDICATED_PATCH | TRANSDERMAL | Status: DC
  Administered 2016-12-10: 25 ug via TRANSDERMAL
  Filled 2016-12-10: qty 1

## 2016-12-10 MED ORDER — OLANZAPINE 10 MG PO TBDP
10.0000 mg | ORAL_TABLET | Freq: Every day | ORAL | Status: DC
  Filled 2016-12-10: qty 1

## 2016-12-10 MED ORDER — LABETALOL HCL 5 MG/ML IV SOLN
10.0000 mg | INTRAVENOUS | Status: DC | PRN
  Administered 2016-12-10 – 2016-12-12 (×6): 10 mg via INTRAVENOUS
  Filled 2016-12-10 (×6): qty 4

## 2016-12-10 MED ORDER — POTASSIUM CHLORIDE 10 MEQ/100ML IV SOLN
10.0000 meq | INTRAVENOUS | Status: AC
  Administered 2016-12-10 (×3): 10 meq via INTRAVENOUS
  Filled 2016-12-10 (×3): qty 100

## 2016-12-10 NOTE — Consult Note (Signed)
PULMONARY / CRITICAL CARE MEDICINE   Name: Trevor Sullivan MRN: [id number] DOB: [birth year]    ADMISSION DATE:  12/08/2016 CONSULTATION DATE:  12/10/2016  REFERRING MD:  Dr. Dyann Kief  CHIEF COMPLAINT:  Confusion  HISTORY OF PRESENT ILLNESS:   This is a 51 year old male with a past medical history significant for back pain he uses chronic opiate medications at home as well as multiple medicines for COPD who was admitted on 12/08/2016 for several days of nausea vomiting and abdominal pain. He was noted in the emergency department to have an elevated lipase so he was admitted by the hospitalist service for acute pancreatitis. Gastroenterology was consulted. He had no prior history of gastroenteritis, his liver function testing was not suggestive of gallstone pancreatitis. He has no prior history of alcohol use that we know of. He denies alcohol use and denies the use of benzodiazepine medicines. Since he has been admitted to the hospital he has been treated with IV fluids and IV narcotics. Despite this he has had progressive agitation, constant moaning and has made many attempts to get out of bed. He has remained constipated. Today he was able to take by mouth some clears and pills. Gastroenterology has seen him and an underlying cause of acute pancreatitis has not been identified. Pulmonary and critical care medicine was consulted on 12/10/2016 for progressive confusion, agitation, hypertension, and tachycardia.  PAST MEDICAL HISTORY :  He  has a past medical history of Chronic low back pain; COPD (chronic obstructive pulmonary disease) ([place]); Hypertension; and PNA (pneumonia).  PAST SURGICAL HISTORY: He  has a past surgical history that includes Back surgery.  Allergies  Allergen Reactions  . Codeine Itching  . Morphine And Related Nausea Only  . Other Other (See Comments)    SSRI's and snri's-- starts shaking.     No current facility-administered medications on file prior to encounter.     Current Outpatient Prescriptions on File Prior to Encounter  Medication Sig  . amLODipine-benazepril (LOTREL) 10-40 MG per capsule Take 1 capsule by mouth daily.  . bisoprolol (ZEBETA) 10 MG tablet Take 20 mg by mouth daily.  . budesonide-formoterol (SYMBICORT) 160-4.5 MCG/ACT inhaler Inhale 2 puffs into the lungs daily.  . cloNIDine (CATAPRES) 0.3 MG tablet Take 0.3 mg by mouth 3 (three) times daily.   Marland Kitchen gabapentin (NEURONTIN) 600 MG tablet Take 1,200 mg by mouth 4 (four) times daily.  Marland Kitchen ibuprofen (ADVIL,MOTRIN) 200 MG tablet Take 400 mg by mouth every 6 (six) hours as needed for headache.  . ipratropium-albuterol (DUONEB) 0.5-2.5 (3) MG/3ML SOLN Take 3 mLs by nebulization every 4 (four) hours as needed. (Patient taking differently: Take 3 mLs by nebulization every 6 (six) hours as needed (Short of breath). )  . ondansetron (ZOFRAN ODT) 4 MG disintegrating tablet 4mg  ODT q4 hours prn nausea/vomit  . oxycodone (ROXICODONE) 30 MG immediate release tablet Take 30 mg by mouth 3 (three) times daily.  . pantoprazole (PROTONIX) 40 MG tablet Take 40 mg by mouth daily.  Marland Kitchen PROAIR HFA 108 (90 BASE) MCG/ACT inhaler Inhale 2 puffs into the lungs every 6 (six) hours as needed for wheezing.   . tiotropium (SPIRIVA) 18 MCG inhalation capsule Place 18 mcg into inhaler and inhale daily.  . predniSONE (STERAPRED UNI-PAK) 10 MG tablet Take 6-5-4-3-2-1 tablets by mouth daily till gone. (Patient not taking: Reported on 12/08/2016)    FAMILY HISTORY:  His indicated that the status of his father is unknown.    SOCIAL HISTORY: He  reports that  he has been smoking Cigarettes.  He has never used smokeless tobacco. He reports that he does not drink alcohol.  REVIEW OF SYSTEMS:   Cannot obtain due to confusion  SUBJECTIVE:  As above  VITAL SIGNS: BP (!) 192/112   Pulse (!) 115   Temp 99.5 F (37.5 C) (Oral)   Resp 18   Ht 5\' 5"  (1.651 m)   Wt 236 lb 1.8 oz (107.1 kg)   SpO2 95%   BMI 39.29 kg/m    HEMODYNAMICS:    VENTILATOR SETTINGS:    INTAKE / OUTPUT: I/O last 3 completed shifts: In: 4995.1 [I.V.:4295.1; IV Piggyback:700] Out: 3850 [Urine:3850]  PHYSICAL EXAMINATION: General:  Obese male, rolling around in bed in apparent pain Neuro:  Awake, alert, able to converse with clear speech intermittently but not oriented to situation, location, year. Follows commands, moves all 4 extremities well. Pupils equal round reactive to light HEENT:  Normocephalic atraumatic, mucous membranes dry, neck supple Cardiovascular:  Tachycardic rate, regular rhythm, no murmurs gallops or rubs Lungs: Normal breath sounds, no wheezing, no rhonchi Abdomen:  Bowel sounds minimally active, mildly distended Musculoskeletal:  Normal bulk and tone Skin:  No rash or skin breakdown  LABS:  BMET  Recent Labs Lab 12/08/16 1755 12/08/16 2150 12/09/16 0334 12/10/16 0347  NA 138  --  138 137  K 2.8*  --  2.8* 3.2*  CL 100*  --  101 102  CO2 27  --  28 28  BUN 28*  --  22* 14  CREATININE 1.20 1.02 0.83 0.61  GLUCOSE 118*  --  123* 120*    Electrolytes  Recent Labs Lab 12/08/16 1755 12/09/16 0334 12/10/16 0347  CALCIUM 6.9* 6.9* 7.1*  MG 1.7 2.1  --     CBC  Recent Labs Lab 12/08/16 1119 12/09/16 0334 12/10/16 0347  WBC 28.2* 26.0* 17.0*  HGB 13.7 13.1 11.4*  HCT 38.0* 37.7* 32.5*  PLT 373 299 194    Coag's No results for input(s): APTT, INR in the last 168 hours.  Sepsis Markers No results for input(s): LATICACIDVEN, PROCALCITON, O2SATVEN in the last 168 hours.  ABG No results for input(s): PHART, PCO2ART, PO2ART in the last 168 hours.  Liver Enzymes  Recent Labs Lab 12/08/16 1119 12/09/16 0334 12/10/16 0347  AST 43* 29 28  ALT 41 29 22  ALKPHOS 69 50 44  BILITOT 0.9 0.7 0.9  ALBUMIN 4.4 3.5 3.0*    Cardiac Enzymes No results for input(s): TROPONINI, PROBNP in the last 168 hours.  Glucose No results for input(s): GLUCAP in the last 168  hours.  Imaging No results found.   STUDIES:  12/08/2016 CT abdomen held this: Findings consistent with acute pancreatitis, no other obvious cause  CULTURES: 12/09/2016 blood culture  ANTIBIOTICS: 11/2016 Meropenem  SIGNIFICANT EVENTS: 12/08/2016 admission 12/10/2016 pulmonary and critical care admission for agitation  LINES/TUBES: None  DISCUSSION: This is a 51 year old male who has developed unexplained encephalopathy in the setting of an ICU hospitalization for acute pancreatitis. While his lipases decreasing and he is not complaining of abdominal pain by history, paradoxically his confusion and market agitation has worsened. He adamantly denies alcohol use as does his family. His acute delirium would fit with a withdrawal type syndrome but he does not use benzodiazepines or alcohol. Sometimes baclofen withdrawal can cause severe reactions. I wonder if the morphine is contributing to his delirium. Also constipation may also be contributing as well. The nursing staff has done a good job of  attempting nonpharmacologic measures. He only received small doses of benzodiazepines (0.5 mg 2 overnight) so I doubt that is contributing to his delirium at this point.  ASSESSMENT / PLAN:   NEUROLOGIC A:   Acute encephalopathy, see differential diagnosis above P:   Stop morphine infusion Use morphine only when necessary when he requested for pain Discontinue fentanyl patch Resume home baclofen Frequent orientation Lights on during the daytime Start Precedex considering severe agitation and the fact that he has got out of bed despite conservative measures Check ammonia Check TSH Check B12 Check RPR Monitor closely in ICU setting  PULMONARY A: COPD, not an exacerbation P:   Continue as needed bronchodilators Continue Dulera and Spiriva  CARDIOVASCULAR A:  Hypertension Tachycardia P:  Continue telemetry monitoring Resume home amlodipine When necessary  labetalol  RENAL A:   Mild hypokalemia P:   Monitor BMET and UOP Replace electrolytes as needed  GASTROINTESTINAL A:   Acute pancreatitis of uncertain etiology P:   Diet per primary service and gastroenterology team Continue pantoprazole Continue as needed anti-nausea medicines  HEMATOLOGIC A:   No acute issues P:  Monitor for bleeding  INFECTIOUS A:   Leukocytosis, improving P:   Antibiotics per primary   FAMILY  - Updates: updated wife bedside 4/18  My cc time 40 minutes  Roselie Awkward, MD [facility] Pager: [phone] Cell: [phone] After 3pm or if no response, call [phone]  12/10/2016, 4:50 PM

## 2016-12-10 NOTE — Progress Notes (Signed)
MD paged again about patients worsening agitation. Pt is unconsolable with RASS of +3. No change in state of agitation despite all prn medications given. Pt has also been assessed for ICU delirium and measures have been taken to try and regulate, however patient continues to be uncooperative. Will continue to follow up.

## 2016-12-10 NOTE — Progress Notes (Signed)
MD aware of patients continued high blood pressure. Per MD continue current treatment plan. MD to adjust clonidine.

## 2016-12-10 NOTE — Progress Notes (Signed)
[facility] Progress Note  Trevor Sullivan 51 y.o. 08/03/1966   Subjective: 52/Caucasian male admitted with pancreatitis. He appears lethargic, seems to sleep mid sentence when talking. As per his nurse, he is on PCA pump but has not been complaining of abdominal pain. He is on clear liquid diet but barely awake enough to eat or drink. No bowel movements reported but passing gas.  Objective: Vital signs in last 24 hours: Vitals:   12/10/16 1200 12/10/16 1442  BP: (!) 227/92 (!) 220/107  Pulse: 97 (!) 102  Resp: (!) 21 13  Temp:      Physical Exam: Gen: alert, no acute distress Abd: Distended, bowel sounds are sluggish but present, non tender, no ascites  Lab Results:  Recent Labs  12/08/16 1755  12/09/16 0334 12/10/16 0347  NA 138  --  138 137  K 2.8*  --  2.8* 3.2*  CL 100*  --  101 102  CO2 27  --  28 28  GLUCOSE 118*  --  123* 120*  BUN 28*  --  22* 14  CREATININE 1.20  < > 0.83 0.61  CALCIUM 6.9*  --  6.9* 7.1*  MG 1.7  --  2.1  --   < > = values in this interval not displayed.  Recent Labs  12/09/16 0334 12/10/16 0347  AST 29 28  ALT 29 22  ALKPHOS 50 44  BILITOT 0.7 0.9  PROT 6.2* 5.7*  ALBUMIN 3.5 3.0*    Recent Labs  12/08/16 1119 12/09/16 0334 12/10/16 0347  WBC 28.2* 26.0* 17.0*  NEUTROABS 25.6*  --   --   HGB 13.7 13.1 11.4*  HCT 38.0* 37.7* 32.5*  MCV 93.1 96.7 97.0  PLT 373 299 194   No results for input(s): LABPROT, INR in the last 72 hours.    Assessment/Plan: 1. Acute pancreatitis, etiology unclear, as there is no report of alcohol abuse or binge drinking, prior USG did not show gallstones. His triglycerides are elevated but triglycerides are usually higher than 1000 mg/dl to cause pancreatitis. May benefit from triglyceride lowering agent as an outpatient. May benefit from IgG subclasses workup for autoimmune pancreatitis. If all workup as mentioned is unremarkable, may need an EUS as an outpatient again. BISAP  score is 2 out of 5 (BUN less than 25/ has impaired mental status?narcotic use vs acute pancreatitis/ SIRS present-elevated WBC,tachycardic/ age is less than 60/ is no evidence of pleural effusions). Recommend continue IV Fluids at 100 ml/hr, currently on D5NS with 40 mEq KCl. As he is lethargic, recommend discontinuing PCA morphine and use IV narcotics on a PRN basis. If patient is awake, may start low fat diet and advance as tolerated.  Trevor Sullivan 12/10/2016, 3:35 PM

## 2016-12-10 NOTE — Progress Notes (Addendum)
PROGRESS NOTE    Trevor Sullivan  RJJ:[id number] DOB: 06-Dec-1965 DOA: 12/08/2016 PCP: Derrill Center, MD   Subjective: Patient reports still some abd pain; no nausea or vomiting. With agitation and confusion overnight. Requiring restrains currently. No CP and is afebrile.   Trevor Sullivan is a 51 y.o. male with medical history significant of chronic low back pain on Fentanyl 25 mcg and Oxycodone 30 mg at home, COPD, HTN who presented to the emergency department on 4/16 complaining of severe abdominal pain that started the night prior to admission. He reported pain as sharp, stabbing, mid abdomen, generalized, constant, accompanied by nausea and vomiting. He denies alcohol use. No prior history of pancreatitis. His last BM was 4/15 but very small, no real BM in 5 days. He had WBC 29, lipase 1261, Cr 1.5, K 2.6 and CT abdomen compatible with acute pancreatitis and no gall bladder involvement. He is being treated with meropenem, aggressive IV fluids, and pain control. GI saw patient on 4/16 and 4/17 and recommends continued supportive care. Patient may need EUS as outpatient after pancreatitis resolves.  Assessment & Plan:   Active Problems:   Abdominal pain   Essential hypertension   H/O spinal fusion   Acute pancreatitis   Pancreatitis   AKI (acute kidney injury) ([place])   Acute Pancreatitis, Severe  - On 4/16, presents with abdominal pain, nausea, vomiting, leukocytosis 29 CT shows pancreatic inflammation. There is marked edema and inflammation associated with the pancreas which extends into the transverse mesocolon, small bowel mesenterary and retroperitoneal space, no hemorrhage or necrosis. - No gallstones per CT scans, denies alcohol drinking, this is likely idiopathic. - Triglyceride 485. Lipase decreasing 1261 >> 325 today. AST 43 >> 29 -will continue IVF's and supportive care -continue analgesics and PRN antiemetics  -Lipase continue to trend down and pain improving overall -will  slowly advance diet once mentation is stable and safety status achieved.  Obesity -low calorie diet and weight loss discussed; family at bedside also informed -Body mass index is 39.29 kg/m.  Hypertensive urgency -Will continue Clonidine -will start labetalol and PRN hydralazine  -still not taking PO's -control pain better   Hypokalemia -still low -will continue repletion as needed -Mg WNL  AKI -On admission, Cr 1.5, BUN 35  -improved and back to normal after IVF's; will monitor trend  COPD -will continue nebulizer therapy with Duoneb and continue Dulera -no wheezing appreciate don exam -on O2 supplementation, mainly from PCA protocol orders  Chronic pain;  -will continue Morphine PCA and will resume home dose fentanyl patch -adjust and titrate down to home regimen once able to take PO's  Anxiety -patient with bad reactions to benzo's in the past according to wife  -will use haldol for agitation -avoid benzo's -constant reassurance/reorientation    Leukocytosis -white blood cells continue trending down; now in the 17K range -no fever and no explicit source of infection appreciated -will discontinue abx's -blood cx's has remained neg  DVT prophylaxis: Heparin Code Status: Full Family Communication: Wife at bedside  Disposition Plan: to be determine; will remain in stepdown for now, high psychosocial needs; also just off labetalol drip; continue morphine PCA and adjust home pain meds. Will follow response.   Consultants:   GI  Procedures:   None  Antimicrobials:  IV Meropenem 4/16 >>4/18  Objective: Vitals:   12/10/16 0740 12/10/16 0800 12/10/16 0815 12/10/16 0945  BP:  (!) 194/105 (!) 203/126   Pulse:  86 95   Resp: 17 19 17  Temp:      TempSrc:      SpO2: 97% (!) 88% 98% 97%  Weight:      Height:        Intake/Output Summary (Last 24 hours) at 12/10/16 0954 Last data filed at 12/10/16 0800  Gross per 24 hour  Intake             3443 ml    Output             2225 ml  Net             1218 ml   Filed Weights   12/08/16 1629 12/08/16 2052  Weight: 102.1 kg (225 lb) 107.1 kg (236 lb 1.8 oz)    Examination: General exam: patient yelling out, confused, agitated/requiring restrains; oriented only to person and knowing wife's name. Still with abd pain. No fever. Respiratory system: good air movement, no wheezing, no crackles. Cardiovascular system: no rubs, no gallops, no murmurs, S1 and S2 heard on exam. Gastrointestinal system: positive BS (even slightly decreased); mild tenderness on deep palpation (epigastric, mid abdomen and lower quadrants); no guarding. Some distension w/o ascites appreciated.  Central nervous system: No focal deficit; CN appears intact, moving four limbs.  Extremities: no edema or cyanosis. Skin: No skin rashes, bruises or induration on exam Psychiatry: judgement and insight poor currently, given ongoing confusion. Patient is agitated and combative at times.  Data Reviewed: I have personally reviewed following labs and imaging studies  CBC:  Recent Labs Lab 12/08/16 1119 12/09/16 0334 12/10/16 0347  WBC 28.2* 26.0* 17.0*  NEUTROABS 25.6*  --   --   HGB 13.7 13.1 11.4*  HCT 38.0* 37.7* 32.5*  MCV 93.1 96.7 97.0  PLT 373 299 401   Basic Metabolic Panel:  Recent Labs Lab 12/08/16 1119 12/08/16 1755 12/08/16 2150 12/09/16 0334 12/10/16 0347  NA 139 138  --  138 137  K 2.6* 2.8*  --  2.8* 3.2*  CL 96* 100*  --  101 102  CO2 28 27  --  28 28  GLUCOSE 142* 118*  --  123* 120*  BUN 35* 28*  --  22* 14  CREATININE 1.53* 1.20 1.02 0.83 0.61  CALCIUM 8.0* 6.9*  --  6.9* 7.1*  MG  --  1.7  --  2.1  --    GFR: Estimated Creatinine Clearance: 127.3 mL/min (by C-G formula based on SCr of 0.61 mg/dL).   Liver Function Tests:  Recent Labs Lab 12/08/16 1119 12/09/16 0334 12/10/16 0347  AST 43* 29 28  ALT 41 29 22  ALKPHOS 69 50 44  BILITOT 0.9 0.7 0.9  PROT 7.8 6.2* 5.7*  ALBUMIN 4.4  3.5 3.0*    Recent Labs Lab 12/08/16 1119 12/09/16 0334  LIPASE 1,261* 325*   Lipid Profile:  Recent Labs  12/08/16 1125  TRIG 485*    Recent Results (from the past 240 hour(s))  Culture, blood (routine x 2)     Status: None (Preliminary result)   Collection Time: 12/08/16  6:05 PM  Result Value Ref Range Status   Specimen Description BLOOD RIGHT HAND  Final   Special Requests IN PEDIATRIC BOTTLE Blood Culture adequate volume  Final   Culture   Final    NO GROWTH < 24 HOURS Performed at [facility], [place] [address], [place], [place] [address]    Report Status PENDING  Incomplete  MRSA PCR Screening     Status: None   Collection  Time: 12/08/16  9:01 PM  Result Value Ref Range Status   MRSA by PCR NEGATIVE NEGATIVE Final    Comment:        The GeneXpert MRSA Assay (FDA approved for NASAL specimens only), is one component of a comprehensive MRSA colonization surveillance program. It is not intended to diagnose MRSA infection nor to guide or monitor treatment for MRSA infections.      Radiology Studies: Ct Abdomen Pelvis W Contrast  Result Date: 12/08/2016 CLINICAL DATA:  Generalized abdominal Pain EXAM: CT ABDOMEN AND PELVIS WITH CONTRAST TECHNIQUE: Multidetector CT imaging of the abdomen and pelvis was performed using the standard protocol following bolus administration of intravenous contrast. CONTRAST:  168mL ISOVUE-300 IOPAMIDOL (ISOVUE-300) INJECTION 61% COMPARISON:  10/23/2014 FINDINGS: Lower chest: No acute abnormality. Hepatobiliary: There is no focal liver abnormality. The gallbladder appears within normal limits. No biliary dilatation. Pancreas: There is diffuse edema and inflammation involving the pancreas compatible with acute pancreatitis. No evidence for pancreatic necrosis and no focal fluid collections identified at this time. Spleen: Normal in size without focal abnormality. Adrenals/Urinary Tract: The adrenal glands are normal. Unremarkable  appearance of the left kidney. 8 mm exophytic lesion arising from the upper pole of right kidney is too small to characterize. No mass or hydronephrosis. Urinary bladder appears normal. Stomach/Bowel: The stomach is normal. The small bowel loops have a normal course and caliber. No obstruction. The appendix is visualized and is unremarkable. No pathologic dilatation of the colon. Vascular/Lymphatic: Aortic atherosclerosis. No aneurysm. No upper abdominal adenopathy. No pelvic or inguinal adenopathy. Reproductive: Prostate is unremarkable. Other: There is edema/inflammation extending into the transverse mesocolon and small bowel mesentary. Free fluid extends into the retroperitoneal space and along the right pericolic gutter into the right iliac fossa. No well defined enhancing fluid collections identified at this time. Musculoskeletal: No aggressive lytic or sclerotic bone lesions. Status post anterior posterior fixation of L5-S1. IMPRESSION: 1. Imaging findings compatible with acute pancreatitis. There is marked edema and inflammation associated with the pancreas which extends into the transverse mesocolon, small bowel mesenterary and retroperitoneal space. 2. No well defined fluid collections identified to suggest pseudocyst. No pancreatic necrosis identified at this time. Electronically Signed   By: Kerby Moors M.D.   On: 12/08/2016 13:50   Dg Chest Port 1 View  Result Date: 12/08/2016 CLINICAL DATA:  Leukocytosis.  COPD EXAM: PORTABLE CHEST 1 VIEW COMPARISON:  10/08/2015 FINDINGS: Heart size is accentuated by low volumes. No suspected cardiomegaly. There is no edema, consolidation, effusion, or pneumothorax. IMPRESSION: Low volume chest without pneumonia. Electronically Signed   By: Monte Fantasia M.D.   On: 12/08/2016 17:01   Scheduled Meds: . cloNIDine  0.3 mg Transdermal Weekly  . heparin  5,000 Units Subcutaneous Q8H  . mouth rinse  15 mL Mouth Rinse BID  . mometasone-formoterol  2 puff  Inhalation BID  . morphine   Intravenous Q4H  . pantoprazole (PROTONIX) IV  40 mg Intravenous Q12H  . tiotropium  18 mcg Inhalation Daily   Continuous Infusions: . dextrose 5 % and 0.9 % NaCl with KCl 40 mEq/L 125 mL/hr at 12/10/16 0800  . potassium chloride (KCL MULTIRUN) 30 mEq in 265 mL IVPB       LOS: 2 days    Time spent: 30 minutes  If 7PM-7AM, please contact night-coverage [email or web address] Password [email or web address] 12/10/2016, 9:54 AM   Barton Dubois [phone] 12/10/2016, 9:54 AM

## 2016-12-10 NOTE — Progress Notes (Signed)
Patient continues to yell out for "food". He takes his monitor off and pulls at equipment however he never pulls at IV that he is getting morphine PCA in. He has been found standing beside bed and bed alarm sounding. Some times he c/o of pain, headache and he can always find the PCA button to push. Patient urinated on floor when we told him his wife would not come pick him up tonight. I did call his wife but she said if she came he would go AMA like he did once before. Patient did talk with wife however that did not calm him. Wife request to talk with doctor about his condition.

## 2016-12-10 NOTE — Progress Notes (Signed)
Patient agitation and level of confusion has continue escalating despite provided therapy. PCCM has been consulted and plan is to start Precedex therapy. At this point will follow PCCM recommendations and treatment and once stable again and off precedex will pick patient back into our service.  Barton Dubois [phone]

## 2016-12-10 NOTE — Progress Notes (Signed)
Patient again out of bed this is the 8th time central monitoring has called me that he has taken all his leads off. He is yelling that he is "going home and he has had it". Patient is very unsteady on his feet, however is trying to walk around the room. Convinced him to sit in chair as we paged Hospitalist. Once again he pulled everything of except his IV that he gets his morphine through. He has pushed the button 42 times tonight only receiving 20 mg. Labetalol gtt is off and BP has been under control except when he is having episodes of yelling and getting up. He has not complained of abdomen pain all night. He will not answer our questions about date time and location but he can remember my name and that we are going to breakfast in the morning

## 2016-12-10 NOTE — Progress Notes (Signed)
Patient remains agitated. QTC was checked in order to give prn dose of haldol. Qtc was 0.47 and 0.5. MD made aware of the results and continued agitation.

## 2016-12-10 NOTE — Progress Notes (Signed)
In less than 30 minutes patient was out of bed again, this time pulling monitor off the wall. We called security and he willing decided to cooperate and get back in bed after they were at bedside. Using tech to sit with patient.

## 2016-12-10 NOTE — Progress Notes (Signed)
MD paged. Patient is having severe agitation is trying to leave states "just let me leave" pt is severely agitated and BP remains elevated. Pt got out of bed and ripped everything off including both IV's. Wife at the bedside. Will try to get a new IV.

## 2016-12-11 DIAGNOSIS — I10 Essential (primary) hypertension: Secondary | ICD-10-CM

## 2016-12-11 DIAGNOSIS — N179 Acute kidney failure, unspecified: Secondary | ICD-10-CM

## 2016-12-11 DIAGNOSIS — K8500 Idiopathic acute pancreatitis without necrosis or infection: Principal | ICD-10-CM

## 2016-12-11 LAB — LIPASE, BLOOD: Lipase: 54 U/L — ABNORMAL HIGH (ref 11–51)

## 2016-12-11 LAB — AMMONIA: Ammonia: 44 umol/L — ABNORMAL HIGH (ref 9–35)

## 2016-12-11 LAB — TSH: TSH: 0.795 u[IU]/mL (ref 0.350–4.500)

## 2016-12-11 LAB — VITAMIN B12: Vitamin B-12: 297 pg/mL (ref 180–914)

## 2016-12-11 MED ORDER — POTASSIUM CHLORIDE CRYS ER 20 MEQ PO TBCR
40.0000 meq | EXTENDED_RELEASE_TABLET | Freq: Once | ORAL | Status: AC
  Administered 2016-12-11: 40 meq via ORAL
  Filled 2016-12-11: qty 2

## 2016-12-11 MED ORDER — FENTANYL 25 MCG/HR TD PT72
25.0000 ug | MEDICATED_PATCH | TRANSDERMAL | Status: DC
  Administered 2016-12-11: 25 ug via TRANSDERMAL
  Filled 2016-12-11: qty 1

## 2016-12-11 MED ORDER — DIAZEPAM 5 MG PO TABS
5.0000 mg | ORAL_TABLET | Freq: Four times a day (QID) | ORAL | Status: DC
  Administered 2016-12-11 – 2016-12-12 (×3): 5 mg via ORAL
  Filled 2016-12-11 (×3): qty 1

## 2016-12-11 MED ORDER — DEXMEDETOMIDINE HCL IN NACL 400 MCG/100ML IV SOLN
0.4000 ug/kg/h | INTRAVENOUS | Status: DC
  Administered 2016-12-11: 0.6 ug/kg/h via INTRAVENOUS
  Administered 2016-12-11: 1 ug/kg/h via INTRAVENOUS
  Filled 2016-12-11: qty 100

## 2016-12-11 MED ORDER — DEXMEDETOMIDINE HCL IN NACL 400 MCG/100ML IV SOLN: 0.4000 ug/kg/h | INTRAVENOUS | Status: DC

## 2016-12-11 NOTE — Progress Notes (Signed)
[facility] Progress Note  Trevor Sullivan 51 y.o. 01-Oct-1965   Subjective: Denies abdominal pain. Less confused. Wife at bedside.  Objective: Vital signs in last 24 hours: Vitals:   12/11/16 0514 12/11/16 0600  BP:  137/69  Pulse:  78  Resp:  19  Temp: 98.6 F (37 C)     Physical Exam: Gen: lethargic, no acute distress, obese CV: RRR  Chest: CTA B Abd: less tender with guarding, soft, nondistended, +BS, obese Ext: no edema  Lab Results:  Recent Labs  12/08/16 1755  12/09/16 0334 12/10/16 0347  NA 138  --  138 137  K 2.8*  --  2.8* 3.2*  CL 100*  --  101 102  CO2 27  --  28 28  GLUCOSE 118*  --  123* 120*  BUN 28*  --  22* 14  CREATININE 1.20  < > 0.83 0.61  CALCIUM 6.9*  --  6.9* 7.1*  MG 1.7  --  2.1  --   < > = values in this interval not displayed.  Recent Labs  12/09/16 0334 12/10/16 0347  AST 29 28  ALT 29 22  ALKPHOS 50 44  BILITOT 0.7 0.9  PROT 6.2* 5.7*  ALBUMIN 3.5 3.0*    Recent Labs  12/08/16 1119 12/09/16 0334 12/10/16 0347  WBC 28.2* 26.0* 17.0*  NEUTROABS 25.6*  --   --   HGB 13.7 13.1 11.4*  HCT 38.0* 37.7* 32.5*  MCV 93.1 96.7 97.0  PLT 373 299 194   No results for input(s): LABPROT, INR in the last 72 hours.    Assessment/Plan: Acute pancreatitis - resolving. Advance diet slowly to low fat diet. Outpt EUS in June. Confusion - question meds. Per primary team Will sign off. Call if questions.   [place] C. 12/11/2016, 8:52 AM   Pager [phone]  AFTER 5 pm or on weekends call [phone] ID: Trevor Sullivan, male   DOB: [birth year], 51 y.o.   MRN: [id number]

## 2016-12-11 NOTE — Progress Notes (Signed)
PULMONARY / CRITICAL CARE MEDICINE   Name: Trevor Sullivan MRN: [id number] DOB: Feb 16, 1966    ADMISSION DATE:  12/08/2016 CONSULTATION DATE:  12/10/2016  REFERRING MD:  Dr. Dyann Kief  CHIEF COMPLAINT:  Confusion  BRIEF SUMMARY:  51 year old male with a past medical history significant for COPD & back pain on chronic opioids admitted 4/16 for nausea/vomiting and abdominal pain.  Initial ER evaluation notable for elevated lipase and was admitted for acute pancreatitis. GI consulted.  LFT's not suggestive of gallstone pancreatitis. He developed progressive agitation / delirium and constipation. Underlying source of pancreatitis not identified.  PCCM consulted on 4/18 for progressive confusion, agitation, hypertension and tachycardia. Precedex gtt initiated with improvement in mentation as of 4/19 am.     SUBJECTIVE:  RN reports pt did not sleep overnight - was agitated with attempts to get out of bed.  On 1.3 of precedex this am.    VITAL SIGNS: BP 137/69   Pulse 78   Temp 98.6 F (37 C) (Oral)   Resp 19   Ht 5\' 5"  (1.651 m)   Wt 231 lb 11.3 oz (105.1 kg)   SpO2 94%   BMI 38.56 kg/m   HEMODYNAMICS:    VENTILATOR SETTINGS:    INTAKE / OUTPUT: I/O last 3 completed shifts: In: 4697.5 [I.V.:4197.5; IV Piggyback:500] Out: 3710 [Urine:3255]  PHYSICAL EXAMINATION: General: obese adult male in NAD lying in bed HEENT: MM pink/moist PSY: calm/appropriate Neuro: awakens to voice, calm, oriented, follows commands CV: s1s2 rrr, no m/r/g PULM: even/non-labored, lungs bilaterally clear  GY:IRSW, non-tender, bsx4 active  Extremities: warm/dry, no edema  Skin: no rashes or lesions   LABS:  BMET  Recent Labs Lab 12/08/16 1755 12/08/16 2150 12/09/16 0334 12/10/16 0347  NA 138  --  138 137  K 2.8*  --  2.8* 3.2*  CL 100*  --  101 102  CO2 27  --  28 28  BUN 28*  --  22* 14  CREATININE 1.20 1.02 0.83 0.61  GLUCOSE 118*  --  123* 120*    Electrolytes  Recent Labs Lab  12/08/16 1755 12/09/16 0334 12/10/16 0347  CALCIUM 6.9* 6.9* 7.1*  MG 1.7 2.1  --     CBC  Recent Labs Lab 12/08/16 1119 12/09/16 0334 12/10/16 0347  WBC 28.2* 26.0* 17.0*  HGB 13.7 13.1 11.4*  HCT 38.0* 37.7* 32.5*  PLT 373 299 194    Coag's No results for input(s): APTT, INR in the last 168 hours.  Sepsis Markers No results for input(s): LATICACIDVEN, PROCALCITON, O2SATVEN in the last 168 hours.  ABG No results for input(s): PHART, PCO2ART, PO2ART in the last 168 hours.  Liver Enzymes  Recent Labs Lab 12/08/16 1119 12/09/16 0334 12/10/16 0347  AST 43* 29 28  ALT 41 29 22  ALKPHOS 69 50 44  BILITOT 0.9 0.7 0.9  ALBUMIN 4.4 3.5 3.0*    Cardiac Enzymes No results for input(s): TROPONINI, PROBNP in the last 168 hours.  Glucose No results for input(s): GLUCAP in the last 168 hours.  Imaging No results found.   STUDIES:  4/16  CT abdomen held this: Findings consistent with acute pancreatitis, no other obvious cause  CULTURES: BCx2 4/17 >>   ANTIBIOTICS: Meropenem 4/18 >>   SIGNIFICANT EVENTS: 4/16  admission 4/18  pulmonary and critical care consulted for agitation 4/19  Improved delirium / agitation  LINES/TUBES: None  DISCUSSION: 51 year old male who has developed unexplained encephalopathy in the setting of an ICU hospitalization for acute pancreatitis.  Developed worsening delirium 4/18 and required precedex.  Reportedly does not drink alcohol.    ASSESSMENT / PLAN:   NEUROLOGIC A:   Acute encephalopathy, see differential diagnosis above Chronic Pain  Hx of Paradoxical Reaction to Benzo's  P:   Wean precedex gtt to off  GOAL RASS: 0 to -1  Re-attempt to obtain ammonia, TSH, B12, RPR, lipase Frequent reorientation  Continue home baclofen  PRN morphine for pain  Hold home fentanyl patch  Monitor in ICU  Continue zypreza, valium  Restraints for medical safety  PULMONARY A: COPD - without acute exacerbation P:   Continue  Dulera + Spiriva  Continue PRN bronchodilators  CARDIOVASCULAR A:  Hypertension Tachycardia P:  Monitor in ICU for now  Continue home amlodipine  Continue clonidine patch, labetalol PRN labetalol   RENAL A:   Hypokalemia P:   Trend BMP / urinary output Replace electrolytes as indicated Avoid nephrotoxic agents, ensure adequate renal perfusion   GASTROINTESTINAL A:   Acute pancreatitis of uncertain etiology P:   Clear liquid diet  Continue PPI  PRN zofran  HEMATOLOGIC A:   Anemia  P:  Trend CBC  Monitor for bleeding   INFECTIOUS A:   Leukocytosis, improving P:   Monitor off abx  Follow cultures above   FAMILY  - Updates: no family at bedside am 4/19   CC Time: 35 minutes   Noe Gens, NP-C [facility] Pgr: [phone] or if no answer [phone] 12/11/2016, 7:37 AM

## 2016-12-11 NOTE — Progress Notes (Signed)
[facility] Progress Note Patient Name: Trevor Sullivan DOB: 04/07/1966 MRN: [id number]   Date of Service  12/11/2016  HPI/Events of Note  Patient c/o pain. Was on a Fentanyl 25 mcg patch at home. Already on Morphine 2-4 mg IV PRN Q 3 hours.  eICU Interventions  Will order: 1. Fentanyl Patch 25 mcg/hr to skin Q 72 hours.   Would start with the lower dose of Morphine for breakthrough pains.      Intervention Category Major Interventions: Other:  Lysle Dingwall 12/11/2016, 11:39 PM

## 2016-12-12 DIAGNOSIS — E876 Hypokalemia: Secondary | ICD-10-CM

## 2016-12-12 LAB — BASIC METABOLIC PANEL
Anion gap: 8 (ref 5–15)
BUN: 9 mg/dL (ref 6–20)
CALCIUM: 8.1 mg/dL — AB (ref 8.9–10.3)
CHLORIDE: 107 mmol/L (ref 101–111)
CO2: 27 mmol/L (ref 22–32)
CREATININE: 0.65 mg/dL (ref 0.61–1.24)
GFR calc non Af Amer: >60 mL/min (ref >60)
GLUCOSE: 115 mg/dL — AB (ref 65–99)
Potassium: 3.4 mmol/L — ABNORMAL LOW (ref 3.5–5.1)
Sodium: 142 mmol/L (ref 135–145)

## 2016-12-12 LAB — CBC
HEMATOCRIT: 28.9 % — AB (ref 39.0–52.0)
Hemoglobin: 9.6 g/dL — ABNORMAL LOW (ref 13.0–17.0)
MCH: 32.4 pg (ref 26.0–34.0)
MCHC: 33.2 g/dL (ref 30.0–36.0)
MCV: 97.6 fL (ref 78.0–100.0)
PLATELETS: 209 10*3/uL (ref 150–400)
RBC: 2.96 MIL/uL — ABNORMAL LOW (ref 4.22–5.81)
RDW: 14.4 % (ref 11.5–15.5)
WBC: 10 10*3/uL (ref 4.0–10.5)

## 2016-12-12 LAB — MAGNESIUM: Magnesium: 1.6 mg/dL — ABNORMAL LOW (ref 1.7–2.4)

## 2016-12-12 LAB — RPR: RPR Ser Ql: NONREACTIVE

## 2016-12-12 MED ORDER — CLONIDINE HCL 0.3 MG PO TABS
0.3000 mg | ORAL_TABLET | Freq: Three times a day (TID) | ORAL | Status: DC
  Administered 2016-12-12: 0.3 mg via ORAL
  Filled 2016-12-12: qty 1

## 2016-12-12 MED ORDER — MAGNESIUM SULFATE 2 GM/50ML IV SOLN
2.0000 g | Freq: Once | INTRAVENOUS | Status: AC
  Administered 2016-12-12: 2 g via INTRAVENOUS
  Filled 2016-12-12: qty 50

## 2016-12-12 MED ORDER — POTASSIUM CHLORIDE CRYS ER 20 MEQ PO TBCR
40.0000 meq | EXTENDED_RELEASE_TABLET | Freq: Once | ORAL | Status: AC
  Administered 2016-12-12: 40 meq via ORAL
  Filled 2016-12-12: qty 2

## 2016-12-12 MED ORDER — BISOPROLOL FUMARATE 5 MG PO TABS
20.0000 mg | ORAL_TABLET | Freq: Every day | ORAL | Status: DC
  Administered 2016-12-12: 20 mg via ORAL
  Filled 2016-12-12: qty 4

## 2016-12-12 MED ORDER — ASPIRIN EC 81 MG PO TBEC
324.0000 mg | DELAYED_RELEASE_TABLET | Freq: Once | ORAL | Status: AC
  Administered 2016-12-12: 324 mg via ORAL
  Filled 2016-12-12: qty 4

## 2016-12-12 MED ORDER — SODIUM CHLORIDE 0.9 % IV SOLN
0.4000 ug/kg/h | INTRAVENOUS | Status: AC
  Administered 2016-12-12: 1 ug/kg/h via INTRAVENOUS
  Filled 2016-12-12 (×3): qty 4

## 2016-12-12 MED ORDER — GABAPENTIN 300 MG PO CAPS
300.0000 mg | ORAL_CAPSULE | Freq: Three times a day (TID) | ORAL | Status: DC
  Administered 2016-12-12: 300 mg via ORAL
  Filled 2016-12-12: qty 1

## 2016-12-12 NOTE — Progress Notes (Signed)
PULMONARY / CRITICAL CARE MEDICINE   Name: Trevor Sullivan MRN: [id number] DOB: 1966-07-16    ADMISSION DATE:  12/08/2016 CONSULTATION DATE:  12/10/2016  REFERRING MD:  Dr. Dyann Kief  CHIEF COMPLAINT:  Confusion  BRIEF SUMMARY:  51 year old male with a past medical history significant for COPD & back pain on chronic opioids admitted 4/16 for nausea/vomiting and abdominal pain.  Initial ER evaluation notable for elevated lipase and was admitted for acute pancreatitis. GI consulted.  LFT's not suggestive of gallstone pancreatitis. He developed progressive agitation / delirium and constipation. Underlying source of pancreatitis not identified.  PCCM consulted on 4/18 for progressive confusion, agitation, hypertension and tachycardia. Precedex gtt initiated with improvement in mentation as of 4/19 am.     SUBJECTIVE:  Pt and wife insistent to go home.  No acute events overnight.  Weaning off precedex gtt.  K/Mg low on am labs.  Pt denies abdominal pain (punches himself in the abdomen to show he doesn't hurt)  VITAL SIGNS: BP (!) 152/105   Pulse 93   Temp 97.5 F (36.4 C) (Oral)   Resp 19   Ht 5\' 5"  (1.651 m)   Wt 234 lb 5.6 oz (106.3 kg)   SpO2 97%   BMI 39.00 kg/m   HEMODYNAMICS:    VENTILATOR SETTINGS:    INTAKE / OUTPUT: I/O last 3 completed shifts: In: 4029.7 [I.V.:4029.7] Out: 2135 [Urine:2135]  PHYSICAL EXAMINATION: General:  Obese adult male in NAD, sitting up in bed HEENT: MM pink/moist, poor dentition  PSY: calm/appropriate, sense of pressured speech  Neuro: AAOx4, speech clear, MAE CV: s1s2 rrr, no m/r/g PULM: even/non-labored, lungs bilaterally clear OE:VOJJ, non-tender, bsx4 active  Extremities: warm/dry, no edema  Skin: no rashes or lesions   LABS:  BMET  Recent Labs Lab 12/09/16 0334 12/10/16 0347 12/12/16 0320  NA 138 137 142  K 2.8* 3.2* 3.4*  CL 101 102 107  CO2 28 28 27   BUN 22* 14 9  CREATININE 0.83 0.61 0.65  GLUCOSE 123* 120* 115*     Electrolytes  Recent Labs Lab 12/08/16 1755 12/09/16 0334 12/10/16 0347 12/12/16 0320  CALCIUM 6.9* 6.9* 7.1* 8.1*  MG 1.7 2.1  --  1.6*    CBC  Recent Labs Lab 12/09/16 0334 12/10/16 0347 12/12/16 0320  WBC 26.0* 17.0* 10.0  HGB 13.1 11.4* 9.6*  HCT 37.7* 32.5* 28.9*  PLT 299 194 209    Coag's No results for input(s): APTT, INR in the last 168 hours.  Sepsis Markers No results for input(s): LATICACIDVEN, PROCALCITON, O2SATVEN in the last 168 hours.  ABG No results for input(s): PHART, PCO2ART, PO2ART in the last 168 hours.  Liver Enzymes  Recent Labs Lab 12/08/16 1119 12/09/16 0334 12/10/16 0347  AST 43* 29 28  ALT 41 29 22  ALKPHOS 69 50 44  BILITOT 0.9 0.7 0.9  ALBUMIN 4.4 3.5 3.0*    Cardiac Enzymes No results for input(s): TROPONINI, PROBNP in the last 168 hours.  Glucose No results for input(s): GLUCAP in the last 168 hours.  Imaging No results found.   STUDIES:  4/16  CT abdomen held this: Findings consistent with acute pancreatitis, no other obvious cause  CULTURES: BCx2 4/17 >>   ANTIBIOTICS: Meropenem 4/18 >> 4/18  SIGNIFICANT EVENTS: 4/16  admission 4/18  pulmonary and critical care consulted for agitation 4/19  Improved delirium / agitation  LINES/TUBES: None  DISCUSSION: 51 year old male who has developed unexplained encephalopathy in the setting of an ICU hospitalization for acute pancreatitis.  Developed worsening delirium 4/18 and required precedex.  Reportedly does not drink alcohol.    ASSESSMENT / PLAN:   NEUROLOGIC A:   Acute encephalopathy - ? 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  P:   Wean precedex gtt to off  Resume home medications  PRN morphine for pain  Continue home fentanyl patch  Continue zpyrexa Resume Neurontin at reduced dose  PULMONARY A: COPD - without acute exacerbation P:   Continue dulera + spiriva   PRN bronchodilators  CARDIOVASCULAR A:  Hypertension Tachycardia P:  Resume home bisoprolol, clonodine  Stop scheduled labetalol  Continue norvasc  PRN labetalol   RENAL A:   Hypokalemia P:   Trend BMP / urinary output Replace electrolytes as indicated Avoid nephrotoxic agents, ensure adequate renal perfusion  GASTROINTESTINAL A:   Acute pancreatitis of uncertain etiology P:   Diet as tolerated  PPI  PRN zofran for nausea  HEMATOLOGIC A:   Anemia  P:  Trend CBC   INFECTIOUS A:   Leukocytosis, improving P:   Monitor off abx    FAMILY  - Updates: Wife updated at bedside.  Both patient and wife are pushing to go home today.     Noe Gens, NP-C [facility] Pgr: [phone] or if no answer [phone] 12/12/2016, 8:18 AM

## 2016-12-12 NOTE — Progress Notes (Signed)
Pt at 10:30 asked to see CCM to see in regards to possible discharge. Pt was educated that Precedex gtt had just been turned off and that NP would be in to see him around lunch time. Pt called out stating he is ready to leave against medical advice. Pt ia already halfway dressed. CCM is notified, paperwork for AMA is drawn and signed at 10:54. Pt walked out with wife at 10:05 am. Pt looked SOB but was independently walking by wife's side.

## 2016-12-12 NOTE — Progress Notes (Signed)
Date:  December 12, 2016 Chart reviewed for concurrent status and case management needs. Will continue to follow patient progress. Discharge Planning: following for needs Expected discharge date: [id number] Rhonda Davis, BSN, RN3, CCM   [phone] 

## 2016-12-12 NOTE — Discharge Summary (Signed)
Physician Discharge Summary  Patient ID: Trevor Sullivan MRN: [id number] DOB/AGE: 1966-06-23 51 y.o.  Admit date: 12/08/2016 Discharge date: 12/12/2016    Discharge Diagnoses:  Acute Encephalopathy  Chronic Pain on Narcotics Hx of Paradoxical Reaction to Benzodiazepines  COPD Hypertension  Tachycardia  Hypokalemia  Acute Pancreatitis of Unclear Etiology  Anemia  Leukocytosis                                                                        DISCHARGE PLAN BY DIAGNOSIS      Acute Encephalopathy  Chronic Pain on Narcotics Hx of Paradoxical Reaction to Benzodiazepines  COPD Hypertension  Tachycardia  Hypokalemia  Acute Pancreatitis of Unclear Etiology  Anemia  Leukocytosis   Discharge Plan: Patient left AMA before discharge instructions could be completed / given.   Made a hospital follow up for him earlier in am with discussion regarding need for follow up lab work to include a BMP, lipase and magnesium.  His wife is a PA and she verbalized understanding.                       DISCHARGE SUMMARY    51 year old male with a past medical history significant for COPD & back pain on chronic opioids admitted 4/16 for nausea/vomiting and abdominal pain.  Initial ER evaluation notable for elevated lipase and was admitted for acute pancreatitis. GI consulted.  LFT's not suggestive of gallstone pancreatitis. He developed progressive agitation / delirium and constipation. Underlying source of pancreatitis not identified.  PCCM consulted on 4/18 for progressive confusion, agitation, hypertension and tachycardia. Precedex gtt initiated with improvement in mentation as of 4/19 am. He remained on precedex until 4/20 am when it was weaned off.  The patient expressly denied alcohol use but his behavior in the hospital / symptoms raised concern for otherwise. He had not been cleared for discharge as he had just been weaned off Precedex hours earlier.  The patient was oriented to  person, place, time, events and capable of making his own decisions.  His wife, a PA, was at the bedside and stated "I am a PA, I can monitor him from home". Staff informed him of risks of leaving against advice and that insurance may not cover the hospitalization if he leaves AMA.  The wife stated "I am a PA and that is not true".   the Unfortunately, he would not wait for discharge reconciliation to be completed and left against medical advice. AMA formed signed per patient prior to leaving.    STUDIES:  4/16  CT abdomen held this: Findings consistent with acute pancreatitis, no other obvious cause  CULTURES: BCx2 4/17 >>   ANTIBIOTICS: Meropenem 4/18 >> 4/18  SIGNIFICANT EVENTS: 4/16  admission 4/18  pulmonary and critical care consulted for agitation 4/19  Improved delirium / agitation    Discharge Exam: See exam from am 4/20.  Pt left AMA.     Vitals:   12/12/16 0919 12/12/16 0944 12/12/16 1000 12/12/16 1100  BP:  (!) 202/110 (!) 183/95   Pulse: 83  94 96  Resp: 20  16   Temp:      TempSrc:      SpO2: 96%  98% 96%  Weight:  Height:         Discharge Labs  BMET  Recent Labs Lab 12/08/16 1119 12/08/16 1755 12/08/16 2150 12/09/16 0334 12/10/16 0347 12/12/16 0320  NA 139 138  --  138 137 142  K 2.6* 2.8*  --  2.8* 3.2* 3.4*  CL 96* 100*  --  101 102 107  CO2 28 27  --  28 28 27   GLUCOSE 142* 118*  --  123* 120* 115*  BUN 35* 28*  --  22* 14 9  CREATININE 1.53* 1.20 1.02 0.83 0.61 0.65  CALCIUM 8.0* 6.9*  --  6.9* 7.1* 8.1*  MG  --  1.7  --  2.1  --  1.6*    CBC  Recent Labs Lab 12/09/16 0334 12/10/16 0347 12/12/16 0320  HGB 13.1 11.4* 9.6*  HCT 37.7* 32.5* 28.9*  WBC 26.0* 17.0* 10.0  PLT 299 194 209     Follow-up Information    Trevor Sullivan., MD Follow up on 12/16/2016.   Specialty:  Family Medicine Why:  Will see Trevor Ovens, PA. APPT at 11:15, arrive at 11:00.   Contact information: [address] [place]  [address] [phone]             Disposition:  Patient left AMA before medication reconciliation could be completed.    Discharged Condition: Trevor Sullivan left the hospital against medical advice.  There was discussion earlier in the am about a possible discharge as he was pushing to go home.  Before any paper work or reassessment could be completed, he left against medical advice.  Attempted to call both Trevor Sullivan and his wife Trevor Sullivan post discharge to give them his follow up appointment.  Generic message left with follow up appointment time. No patient information included.       Signed: Noe Gens, NP-C [facility] Pgr: [phone] Office: [phone]

## 2016-12-13 LAB — CULTURE, BLOOD (ROUTINE X 2)
CULTURE: NO GROWTH
SPECIAL REQUESTS: ADEQUATE

## 2016-12-14 LAB — CULTURE, BLOOD (ROUTINE X 2)
Culture: NO GROWTH
SPECIAL REQUESTS: ADEQUATE

## 2017-02-05 DIAGNOSIS — M545 Low back pain: Secondary | ICD-10-CM | POA: Diagnosis not present

## 2017-02-05 DIAGNOSIS — M5412 Radiculopathy, cervical region: Secondary | ICD-10-CM | POA: Diagnosis not present

## 2017-02-05 DIAGNOSIS — G8929 Other chronic pain: Secondary | ICD-10-CM | POA: Diagnosis not present

## 2017-02-24 DIAGNOSIS — R112 Nausea with vomiting, unspecified: Secondary | ICD-10-CM | POA: Diagnosis not present

## 2017-02-24 DIAGNOSIS — R1310 Dysphagia, unspecified: Secondary | ICD-10-CM | POA: Diagnosis not present

## 2017-02-24 DIAGNOSIS — K228 Other specified diseases of esophagus: Secondary | ICD-10-CM | POA: Diagnosis not present

## 2017-10-01 ENCOUNTER — Other Ambulatory Visit: Payer: Self-pay | Admitting: Gastroenterology

## 2017-10-09 ENCOUNTER — Encounter (HOSPITAL_COMMUNITY): Payer: Self-pay | Admitting: Anesthesiology

## 2017-10-09 ENCOUNTER — Ambulatory Visit (HOSPITAL_COMMUNITY): Admission: RE | Admit: 2017-10-09 | Payer: Medicare Other | Source: Ambulatory Visit | Admitting: Gastroenterology

## 2017-10-09 ENCOUNTER — Encounter (HOSPITAL_COMMUNITY): Admission: RE | Payer: Self-pay | Source: Ambulatory Visit

## 2017-10-09 ENCOUNTER — Encounter (HOSPITAL_COMMUNITY): Payer: Self-pay | Admitting: Certified Registered Nurse Anesthetist

## 2017-10-09 SURGERY — UPPER ENDOSCOPIC ULTRASOUND (EUS) LINEAR
Anesthesia: Monitor Anesthesia Care

## 2017-10-09 MED ORDER — PROPOFOL 10 MG/ML IV BOLUS
INTRAVENOUS | Status: AC
  Filled 2017-10-09: qty 60

## 2017-10-09 NOTE — Anesthesia Preprocedure Evaluation (Deleted)
Anesthesia Evaluation  Patient identified by MRN, date of birth, ID band Patient awake    Reviewed: Allergy & Precautions, H&P , NPO status , Patient's Chart, lab work & pertinent test results  Airway Mallampati: II   Neck ROM: full    Dental   Pulmonary COPD, Current Smoker,    breath sounds clear to auscultation       Cardiovascular hypertension,  Rhythm:regular Rate:Normal     Neuro/Psych    GI/Hepatic GERD  ,pancreatitis   Endo/Other    Renal/GU      Musculoskeletal   Abdominal   Peds  Hematology   Anesthesia Other Findings   Reproductive/Obstetrics                             Anesthesia Physical Anesthesia Plan  ASA: II  Anesthesia Plan: MAC   Post-op Pain Management:    Induction: Intravenous  PONV Risk Score and Plan: 0 and Propofol infusion and Treatment may vary due to age or medical condition  Airway Management Planned: Nasal Cannula  Additional Equipment:   Intra-op Plan:   Post-operative Plan:   Informed Consent: I have reviewed the patients History and Physical, chart, labs and discussed the procedure including the risks, benefits and alternatives for the proposed anesthesia with the patient or authorized representative who has indicated his/her understanding and acceptance.     Plan Discussed with: CRNA, Anesthesiologist and Surgeon  Anesthesia Plan Comments:         Anesthesia Quick Evaluation

## 2021-08-22 ENCOUNTER — Encounter: Payer: Self-pay | Admitting: Gastroenterology

## 2021-08-23 ENCOUNTER — Encounter: Payer: Self-pay | Admitting: Gastroenterology

## 2021-09-04 ENCOUNTER — Encounter: Payer: Self-pay | Admitting: Pain Medicine

## 2021-09-10 ENCOUNTER — Ambulatory Visit: Payer: Medicare (Managed Care) | Admitting: Pain Medicine

## 2021-10-01 ENCOUNTER — Encounter: Payer: Medicare (Managed Care) | Admitting: Pain Medicine

## 2021-10-08 ENCOUNTER — Telehealth: Payer: Self-pay

## 2021-10-08 NOTE — Telephone Encounter (Signed)
Called to schedule NPV. Left message for patient to call us back and schedule. [phone].     This is a FFT patient. Please contact Judi or myself to schedule at [facility]

## 2021-10-11 ENCOUNTER — Encounter: Payer: Medicare (Managed Care) | Admitting: Pain Medicine

## 2021-10-11 ENCOUNTER — Telehealth: Payer: Self-pay

## 2021-10-11 NOTE — Telephone Encounter (Signed)
Spoke to Harry Weeks and he stated that at this point in time he does not have transportation and will not be able to make any appointment. He stated that he will call us back if and when he is ready to schedule

## 2021-11-01 ENCOUNTER — Encounter: Payer: Medicare (Managed Care) | Admitting: Pain Medicine

## 2022-03-30 LAB — UNMAPPED LAB RESULTS
Hematocrit (HT): 43 % (ref 40–52)
Hemoglobin (HGB) (HT): 15.1 g/dL (ref 13.0–18.0)
MCHC (HT): 35 g/dL (ref 32.0–37.5)
MCV (HT): 95 fL (ref 80–100)
Mean Corpuscular Hemoglobin (MCH) (HT): 33.3 pg (ref 26.0–34.0)
Platelets (HT): 319 10 3/uL (ref 150–450)
RBC (HT): 4.54 10 6/uL (ref 4.40–6.20)
RDW (HT): 12.5 % (ref 0.0–15.2)
WBC (HT): 12.1 10 3/uL — ABNORMAL HIGH (ref 4.0–11.0)

## 2022-04-17 LAB — UNMAPPED LAB RESULTS
ABO RH Blood Type (HT): O POS
Antibody Screen (HT): NEGATIVE

## 2022-05-10 LAB — UNMAPPED LAB RESULTS
Basophil # (HT): 0.1 10 3/uL (ref 0.0–0.2)
Basophil % (HT): 1 % (ref 0–3)
Eosinophil # (HT): 0.2 10 3/uL (ref 0.0–0.6)
Eosinophil % (HT): 2 % (ref 0–5)
Hematocrit (HT): 39 % — ABNORMAL LOW (ref 40–52)
Hemoglobin (HGB) (HT): 13.2 g/dL (ref 13.0–18.0)
Lymphocyte # (HT): 2.6 10 3/uL (ref 1.0–4.8)
Lymphocyte % (HT): 25 % (ref 15–45)
MCHC (HT): 34.2 g/dL (ref 32.0–37.5)
MCV (HT): 99 fL (ref 80–100)
Mean Corpuscular Hemoglobin (MCH) (HT): 33.8 pg (ref 26.0–34.0)
Monocyte # (HT): 0.6 10 3/uL (ref 0.1–1.0)
Monocyte % (HT): 6 % (ref 0–15)
Neutrophil # (HT): 6.8 10 3/uL (ref 1.8–8.0)
Platelets (HT): 317 10 3/uL (ref 150–450)
RBC (HT): 3.91 10 6/uL — ABNORMAL LOW (ref 4.40–6.20)
RDW (HT): 12 % (ref 0.0–15.2)
Seg Neut % (HT): 65 % (ref 45–75)
WBC (HT): 10.4 10 3/uL (ref 4.0–11.0)

## 4658-12-24 DEATH — deceased
